# Patient Record
Sex: Male | Born: 1940 | Race: White | Hispanic: No | Marital: Married | Smoking: Never smoker
Health system: Southern US, Community
[De-identification: ages and names within clinical notes are randomized; demographics above are authoritative.]

## PROBLEM LIST (undated history)

## (undated) DIAGNOSIS — M1990 Unspecified osteoarthritis, unspecified site: Secondary | ICD-10-CM

## (undated) DIAGNOSIS — T7840XA Allergy, unspecified, initial encounter: Secondary | ICD-10-CM

## (undated) DIAGNOSIS — I10 Essential (primary) hypertension: Secondary | ICD-10-CM

## (undated) DIAGNOSIS — J189 Pneumonia, unspecified organism: Secondary | ICD-10-CM

## (undated) HISTORY — PX: HERNIA REPAIR: SHX51

## (undated) HISTORY — PX: ABDOMINAL AORTIC ANEURYSM REPAIR: SUR1152

## (undated) HISTORY — PX: UPPER GI ENDOSCOPY: SHX6162

## (undated) HISTORY — PX: OTHER SURGICAL HISTORY: SHX169

---

## 2004-04-02 ENCOUNTER — Encounter: Payer: Self-pay | Admitting: Cardiology

## 2006-08-08 ENCOUNTER — Inpatient Hospital Stay (HOSPITAL_COMMUNITY): Admission: RE | Admit: 2006-08-08 | Discharge: 2006-08-12 | Payer: Self-pay | Admitting: Specialist

## 2006-09-01 ENCOUNTER — Encounter: Admission: RE | Admit: 2006-09-01 | Discharge: 2006-10-09 | Payer: Self-pay | Admitting: Specialist

## 2007-02-04 ENCOUNTER — Ambulatory Visit: Payer: Self-pay | Admitting: Vascular Surgery

## 2008-01-26 ENCOUNTER — Ambulatory Visit: Payer: Self-pay | Admitting: Vascular Surgery

## 2008-04-03 ENCOUNTER — Encounter: Admission: RE | Admit: 2008-04-03 | Discharge: 2008-04-03 | Payer: Self-pay | Admitting: Family Medicine

## 2008-05-09 ENCOUNTER — Encounter
Admission: RE | Admit: 2008-05-09 | Discharge: 2008-06-22 | Payer: Self-pay | Admitting: Physical Medicine and Rehabilitation

## 2008-07-19 ENCOUNTER — Ambulatory Visit: Payer: Self-pay | Admitting: Vascular Surgery

## 2009-01-24 ENCOUNTER — Ambulatory Visit: Payer: Self-pay | Admitting: Vascular Surgery

## 2009-08-31 ENCOUNTER — Ambulatory Visit: Payer: Self-pay | Admitting: Vascular Surgery

## 2009-09-14 ENCOUNTER — Encounter: Admission: RE | Admit: 2009-09-14 | Discharge: 2009-09-14 | Payer: Self-pay | Admitting: Vascular Surgery

## 2009-09-14 ENCOUNTER — Ambulatory Visit: Payer: Self-pay | Admitting: Vascular Surgery

## 2009-09-18 ENCOUNTER — Ambulatory Visit (HOSPITAL_COMMUNITY): Admission: RE | Admit: 2009-09-18 | Discharge: 2009-09-18 | Payer: Self-pay | Admitting: Vascular Surgery

## 2009-09-18 ENCOUNTER — Ambulatory Visit: Payer: Self-pay | Admitting: Vascular Surgery

## 2009-09-20 ENCOUNTER — Encounter: Payer: Self-pay | Admitting: Cardiology

## 2009-09-21 ENCOUNTER — Ambulatory Visit: Payer: Self-pay

## 2009-09-21 ENCOUNTER — Encounter: Payer: Self-pay | Admitting: Cardiology

## 2009-09-21 ENCOUNTER — Ambulatory Visit: Payer: Self-pay | Admitting: Cardiovascular Disease

## 2009-09-21 ENCOUNTER — Encounter (HOSPITAL_COMMUNITY): Admission: RE | Admit: 2009-09-21 | Discharge: 2009-12-08 | Payer: Self-pay | Admitting: Cardiovascular Disease

## 2009-09-25 DIAGNOSIS — I714 Abdominal aortic aneurysm, without rupture, unspecified: Secondary | ICD-10-CM | POA: Insufficient documentation

## 2009-09-26 ENCOUNTER — Ambulatory Visit: Payer: Self-pay | Admitting: Cardiology

## 2009-09-26 ENCOUNTER — Encounter (INDEPENDENT_AMBULATORY_CARE_PROVIDER_SITE_OTHER): Payer: Self-pay | Admitting: *Deleted

## 2009-09-26 DIAGNOSIS — I10 Essential (primary) hypertension: Secondary | ICD-10-CM | POA: Insufficient documentation

## 2009-09-28 ENCOUNTER — Telehealth (INDEPENDENT_AMBULATORY_CARE_PROVIDER_SITE_OTHER): Payer: Self-pay | Admitting: *Deleted

## 2009-09-28 ENCOUNTER — Ambulatory Visit: Payer: Self-pay | Admitting: Vascular Surgery

## 2009-09-28 ENCOUNTER — Encounter: Payer: Self-pay | Admitting: Cardiology

## 2009-10-09 ENCOUNTER — Telehealth (INDEPENDENT_AMBULATORY_CARE_PROVIDER_SITE_OTHER): Payer: Self-pay | Admitting: *Deleted

## 2009-10-11 ENCOUNTER — Inpatient Hospital Stay (HOSPITAL_COMMUNITY): Admission: RE | Admit: 2009-10-11 | Discharge: 2009-10-16 | Payer: Self-pay | Admitting: Vascular Surgery

## 2009-10-11 ENCOUNTER — Encounter: Payer: Self-pay | Admitting: Vascular Surgery

## 2009-10-26 ENCOUNTER — Ambulatory Visit: Payer: Self-pay | Admitting: Vascular Surgery

## 2009-11-09 ENCOUNTER — Encounter: Payer: Self-pay | Admitting: Cardiology

## 2009-11-09 ENCOUNTER — Ambulatory Visit: Payer: Self-pay | Admitting: Vascular Surgery

## 2010-05-31 ENCOUNTER — Ambulatory Visit: Payer: Self-pay | Admitting: Vascular Surgery

## 2010-06-28 ENCOUNTER — Encounter: Admission: RE | Admit: 2010-06-28 | Discharge: 2010-06-28 | Payer: Self-pay | Admitting: Family Medicine

## 2010-09-28 IMAGING — CT CT PELVIS W/ CM
2 of 5 series · 10 of 36 positions shown, 17 images · IV contrast (READICAT/WATER & [ID] OMNI 300)
Comparison: None.

CT ABDOMEN

Addendum Begins

The patient had a CT of the abdomen pelvis on [DATE]  at [facility]
[[facility]OSP[facility]].  This report has been reviewed.  The soft tissue
mass in the anterior abdominal wall on the left was described on
the prior report however was not measured to determine if there has
been any interval change.
The prior report also describes a 16 x 12 mm cyst in the head of
the pancreas, this is unchanged and has a benign appearance.
Addendum Ends
CLINICAL DATA: AAA, pre surgery.
CT ABDOMEN AND PELVIS WITH CONTRAST
TECHNIQUE: Multidetector CT imaging of the abdomen and pelvis was
performed using the standard protocol following bolus
administration of intravenous contrast.
Contrast: 125 ml 9mnipaque-AUU IV.

[Series 4: routine abdomen · axial · 0.78mm/px · z∈[-358,+72]mm · 9 of 108 slices shown, 15 images]
[im 11/108  soft-tissue]
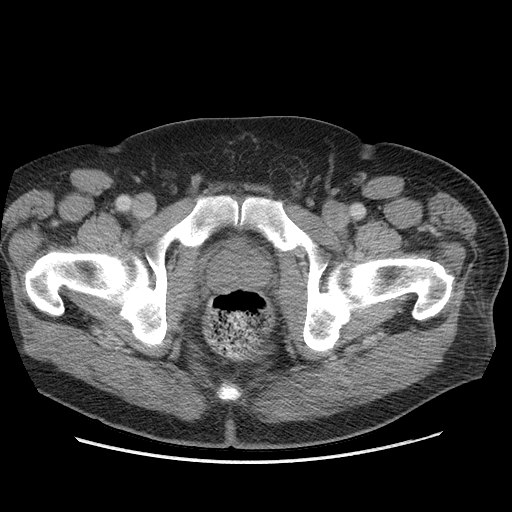
[im 11/108  bone]
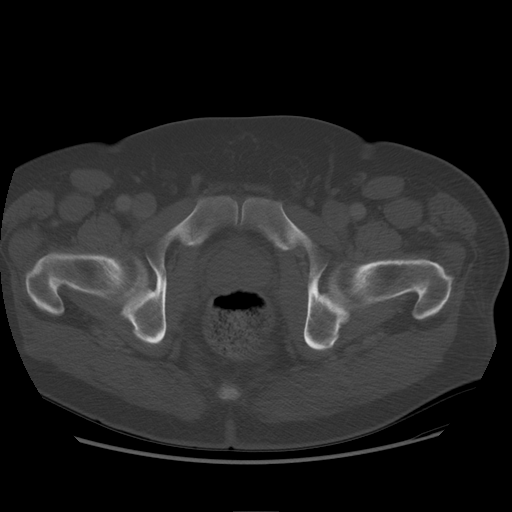
[im 22/108  soft-tissue]
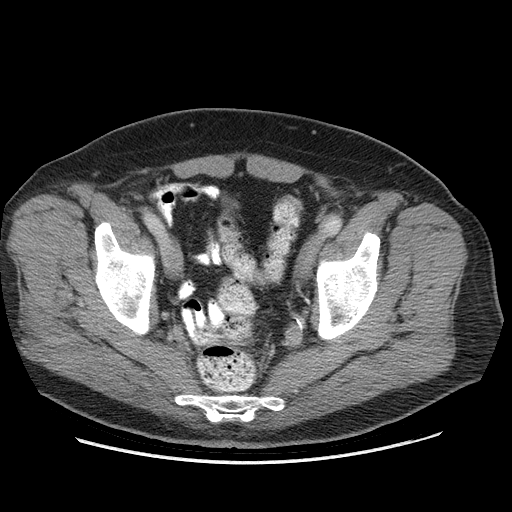
[im 33/108  soft-tissue]
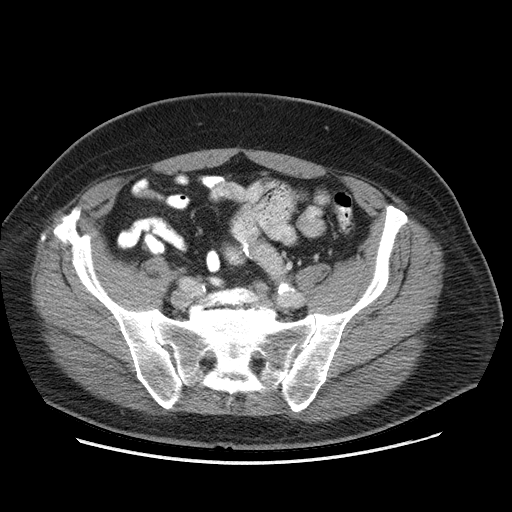
[im 43/108  soft-tissue]
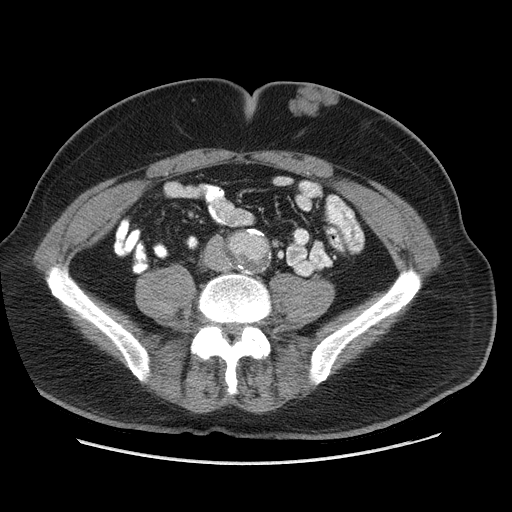
[im 54/108  soft-tissue]
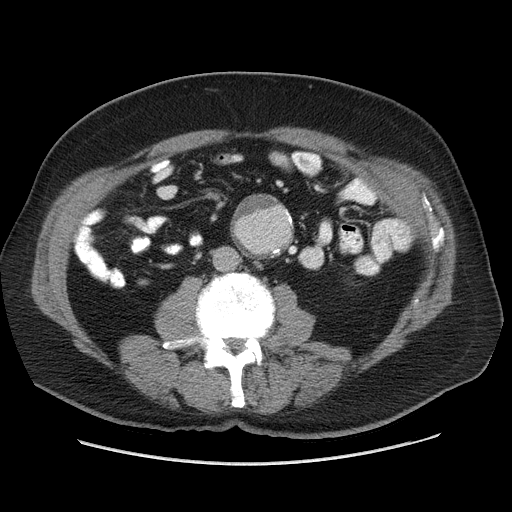
[im 65/108  soft-tissue]
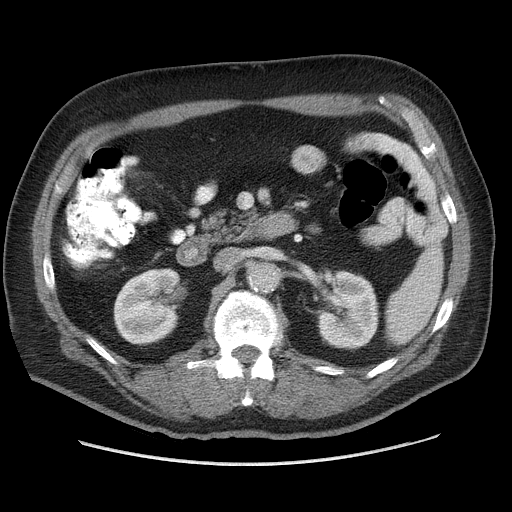
[im 65/108  lung]
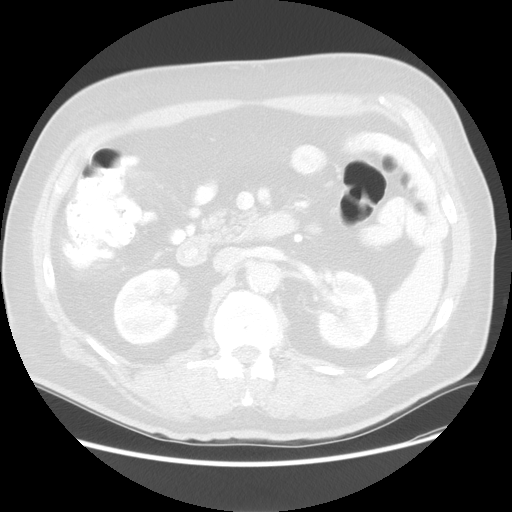
[im 75/108  soft-tissue]
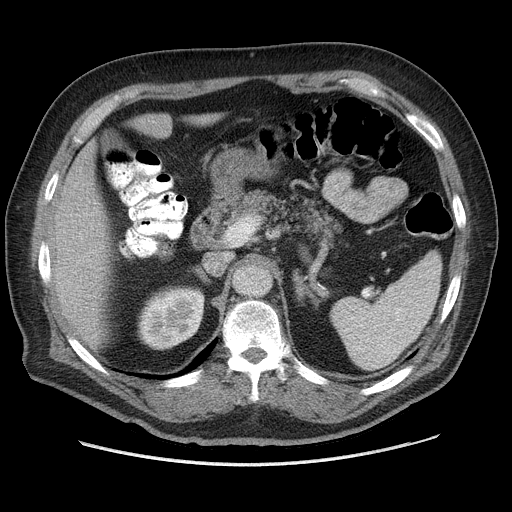
[im 75/108  lung]
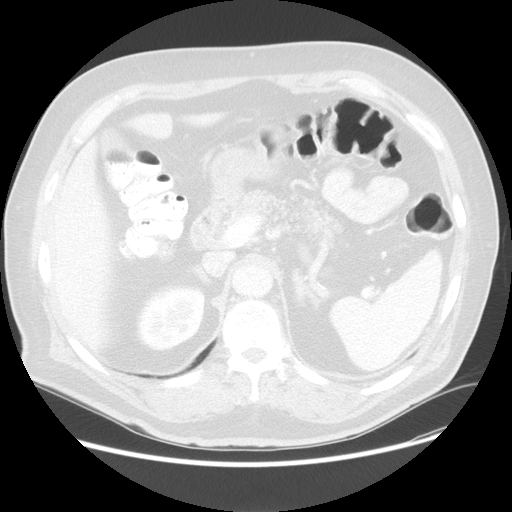
[im 86/108  soft-tissue]
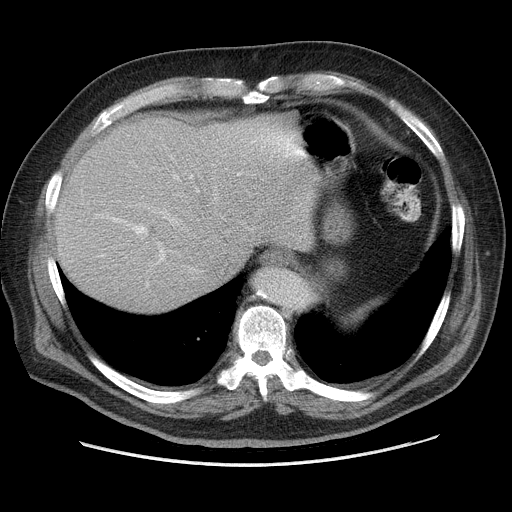
[im 86/108  lung]
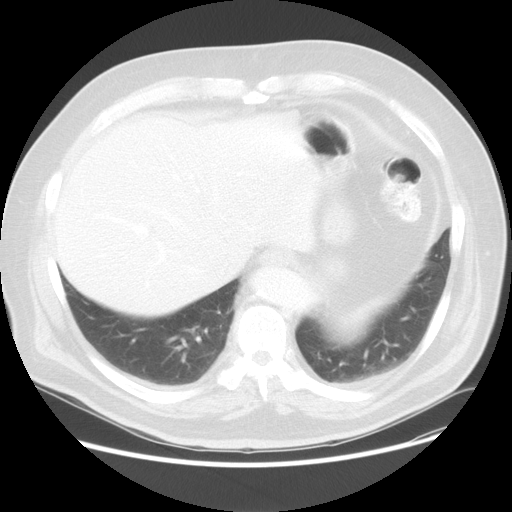
[im 97/108  soft-tissue]
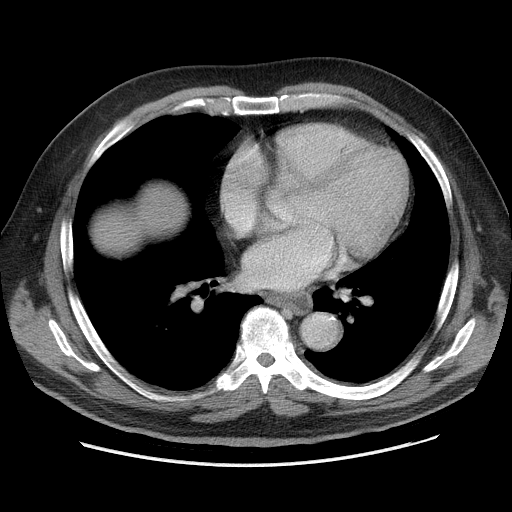
[im 97/108  lung]
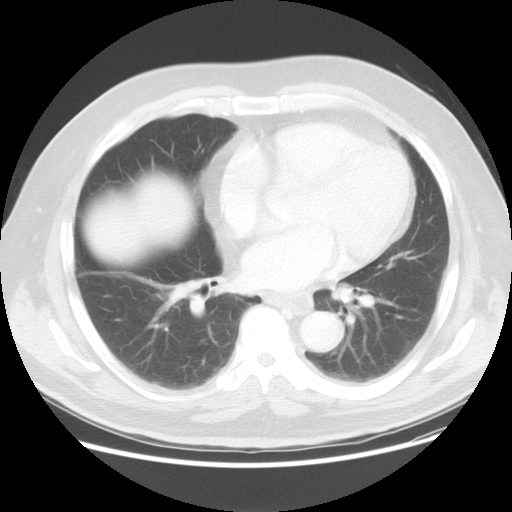
[im 97/108  bone]
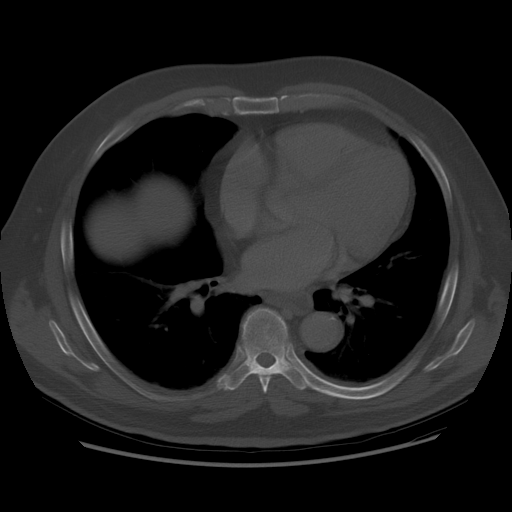

[Series 601: coronal body · coronal · 1.11mm/px · 1 of 131 slices shown, 2 images]
[im 44/131  soft-tissue]
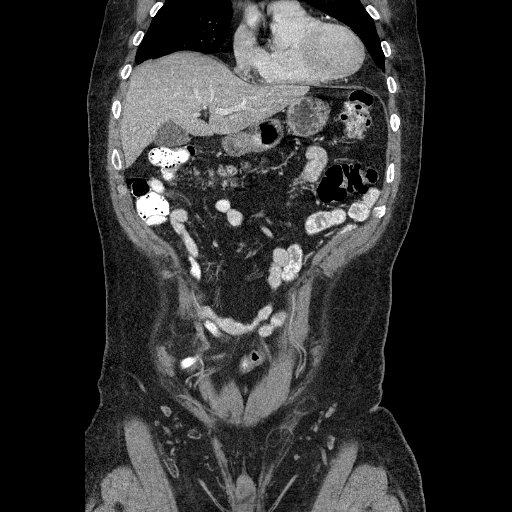
[im 44/131  bone]
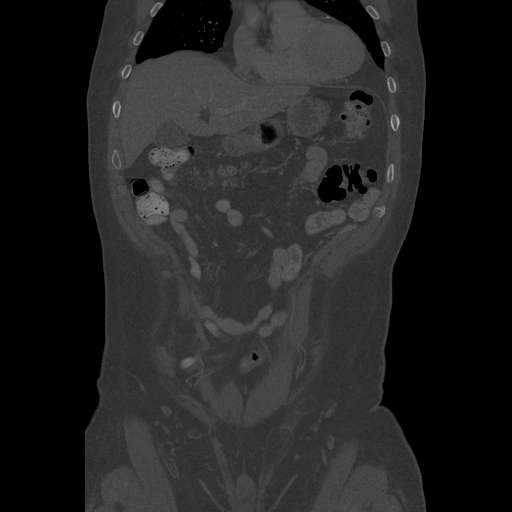

[10 of 36 positions shown; findings below may reference images not displayed]

FINDINGS: There is an infrarenal abdominal aortic aneurysm
measuring up to 5.3 x 5.0 cm.  There is mural thrombus in the
aneurysm and atherosclerotic calcification in the aortic wall.  The
infrarenal abdominal aorta is very tortuous.  The aneurysm does not
involve the renal artery origin.

The liver and gallbladder are normal.  The pancreas spleen and
kidneys are normal.  There is no bowel obstruction or bowel wall
thickening.
IMPRESSION: Infrarenal abdominal aortic aneurysm measuring 5.3 x 5.0 cm.  This
is an atherosclerotic aneurysm with mural thrombus.

CT PELVIS
FINDINGS: The abdominal aortic aneurysm extends to the aortic
bifurcation.  The iliac arteries are diffusely enlarged without a
focal aneurysm.  The right common iliac artery measures 16 mm in
diameter and the left common iliac artery measures 17 mm in
diameter.  This is felt to be due to atherosclerosis.  The iliac
arteries appear widely patent bilaterally.

There is sigmoid diverticulosis.  No acute bowel thickening or
obstruction.  There is no free fluid or adenopathy.  There is an
inguinal hernia on the left containing fat.

There is a lobular soft tissue mass in the subcutaneous fat to the
left of umbilicus.  This measures approximately 2.5 x 3 cm.  There
is a small calcification associate with the mass.  This does not
extend down to the abdominal wall.  This is of uncertain etiology.
Recommend clinical evaluation.  Neurofibroma, metastatic disease,
and lymphoma are possibilities as well as benign skin lesions.
IMPRESSION: Atherosclerotic dilatation of the internal and external iliac
arteries bilaterally without focal aneurysm or arterial stenosis.

Subcutaneous soft tissue mass to the left of the umbilicus of
uncertain etiology.

## 2010-10-25 IMAGING — CR DG CHEST 1V PORT
1 series · 1 of 1 positions shown · non-contrast
Comparison: 10/09/2009

CLINICAL DATA: Status post repair of aneurysm

PORTABLE CHEST - 1 VIEW

[AP]
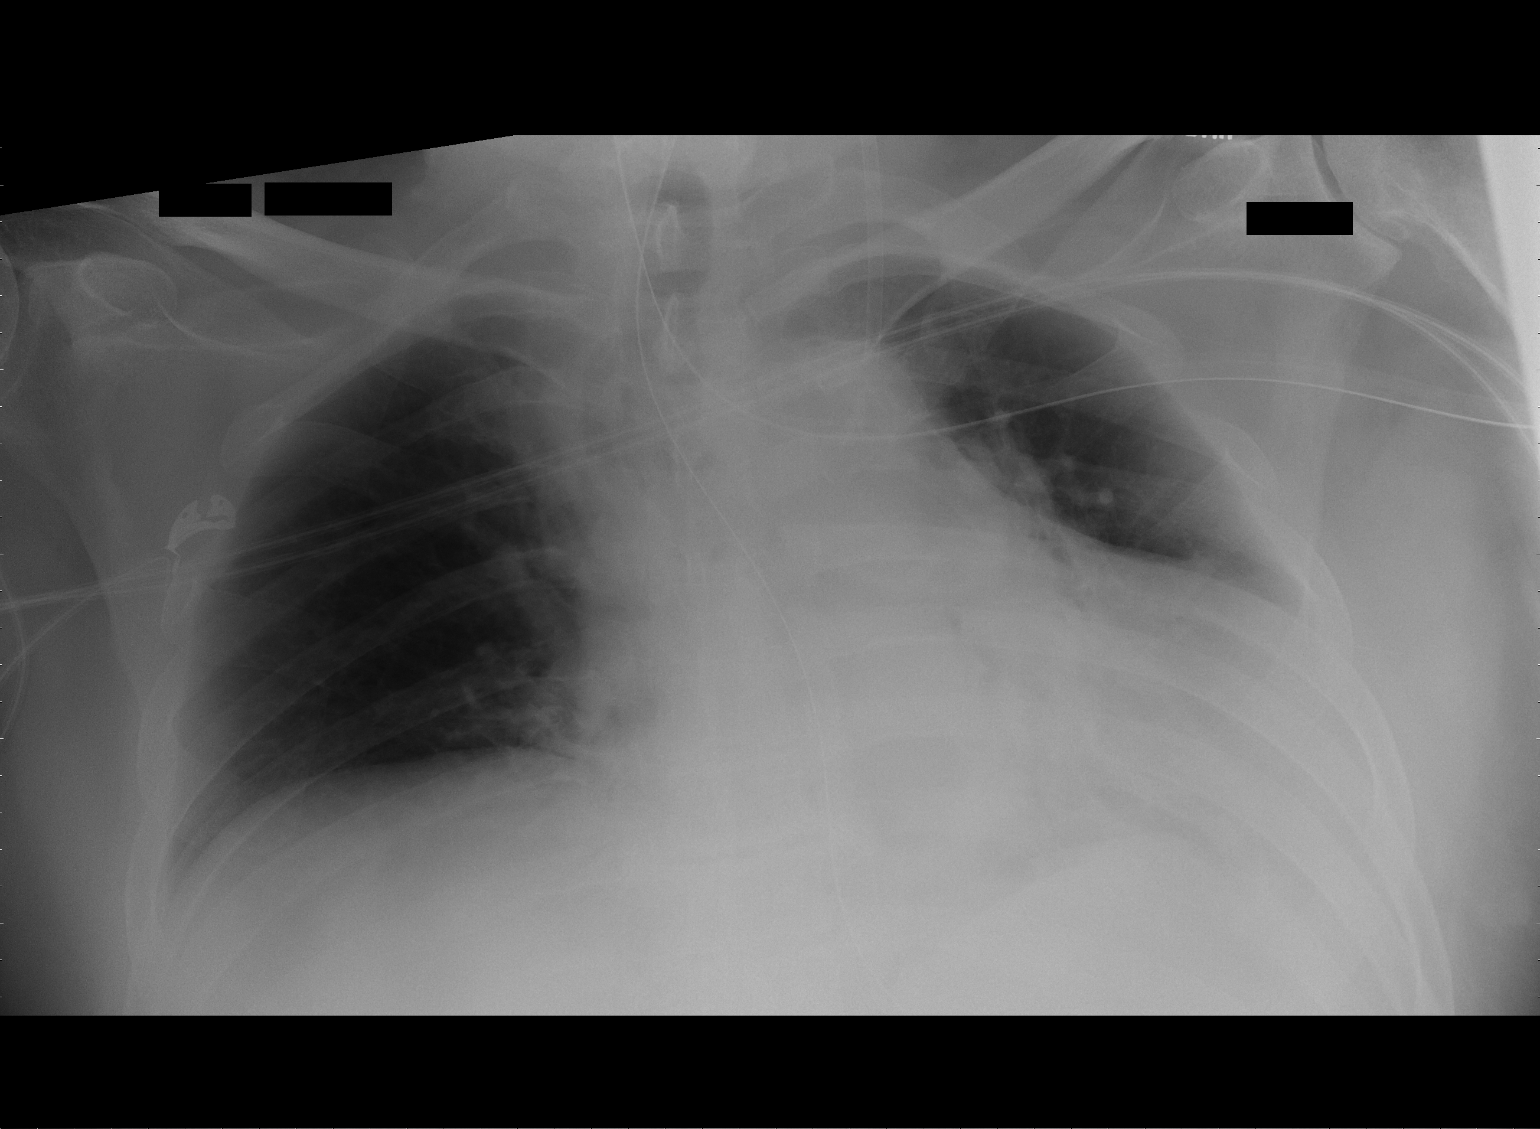

[1 of 1 positions shown; findings below may reference images not displayed]

FINDINGS: There is a left IJ catheter sheath in place the tip is in the
projection of the left lung apex.

There is a nasogastric tube in place.

The heart size is enlarged.

The lung volumes are very low.

No airspace consolidation identified.
IMPRESSION: 1.  Very low lung volumes.
2.  Left IJ catheter sheath in place.

## 2011-03-13 LAB — BASIC METABOLIC PANEL
CO2: 24 mEq/L (ref 19–32)
Calcium: 8 mg/dL — ABNORMAL LOW (ref 8.4–10.5)
Chloride: 101 mEq/L (ref 96–112)
Creatinine, Ser: 0.92 mg/dL (ref 0.4–1.5)
GFR calc Af Amer: >60 mL/min (ref >60)
Glucose, Bld: 156 mg/dL — ABNORMAL HIGH (ref 70–99)
Sodium: 132 mEq/L — ABNORMAL LOW (ref 135–145)

## 2011-03-13 LAB — BASIC METABOLIC PANEL WITH GFR
BUN: 14 mg/dL (ref 6–23)
CO2: 25 meq/L (ref 19–32)
Calcium: 7.7 mg/dL — ABNORMAL LOW (ref 8.4–10.5)
Chloride: 103 meq/L (ref 96–112)
Creatinine, Ser: 1.07 mg/dL (ref 0.4–1.5)
GFR calc Af Amer: >60 mL/min (ref >60)
GFR calc non Af Amer: >60 mL/min (ref >60)
Glucose, Bld: 163 mg/dL — ABNORMAL HIGH (ref 70–99)
Potassium: 3.9 meq/L (ref 3.5–5.1)
Sodium: 134 meq/L — ABNORMAL LOW (ref 135–145)

## 2011-03-13 LAB — TYPE AND SCREEN
ABO/RH(D): O POS
ABO/RH(D): O POS
Antibody Screen: POSITIVE
DAT, IgG: NEGATIVE
Donor AG Type: NEGATIVE
Donor AG Type: NEGATIVE
PT AG Type: NEGATIVE

## 2011-03-13 LAB — AMYLASE: Amylase: 119 U/L (ref 27–131)

## 2011-03-13 LAB — POCT I-STAT 7, (LYTES, BLD GAS, ICA,H+H)
Acid-Base Excess: 1 mmol/L (ref 0.0–2.0)
Bicarbonate: 24.7 mEq/L — ABNORMAL HIGH (ref 20.0–24.0)
O2 Saturation: 100 %
Patient temperature: 36.7
Sodium: 135 mEq/L (ref 135–145)
TCO2: 26 mmol/L (ref 0–100)
pH, Arterial: 7.403 (ref 7.350–7.450)
pH, Arterial: 7.459 — ABNORMAL HIGH (ref 7.350–7.450)
pO2, Arterial: 275 mmHg — ABNORMAL HIGH (ref 80.0–100.0)
pO2, Arterial: 360 mmHg — ABNORMAL HIGH (ref 80.0–100.0)

## 2011-03-13 LAB — COMPREHENSIVE METABOLIC PANEL
ALT: 14 U/L (ref 0–53)
AST: 15 U/L (ref 0–37)
Alkaline Phosphatase: 44 U/L (ref 39–117)
BUN: 13 mg/dL (ref 6–23)
CO2: 25 mEq/L (ref 19–32)
CO2: 27 mEq/L (ref 19–32)
Calcium: 8.9 mg/dL (ref 8.4–10.5)
Chloride: 103 mEq/L (ref 96–112)
Creatinine, Ser: 1.03 mg/dL (ref 0.4–1.5)
GFR calc non Af Amer: >60 mL/min (ref >60)
GFR calc non Af Amer: >60 mL/min (ref >60)
Glucose, Bld: 102 mg/dL — ABNORMAL HIGH (ref 70–99)
Potassium: 4 mEq/L (ref 3.5–5.1)
Total Bilirubin: 1 mg/dL (ref 0.3–1.2)
Total Protein: 6.7 g/dL (ref 6.0–8.3)

## 2011-03-13 LAB — BLOOD GAS, ARTERIAL
Bicarbonate: 25.4 mEq/L — ABNORMAL HIGH (ref 20.0–24.0)
Drawn by: 181601
FIO2: 0.21 %
O2 Content: 10 L/min
Patient temperature: 98.6
pH, Arterial: 7.338 — ABNORMAL LOW (ref 7.350–7.450)
pH, Arterial: 7.419 (ref 7.350–7.450)
pO2, Arterial: 88.5 mmHg (ref 80.0–100.0)

## 2011-03-13 LAB — CBC
HCT: 34.2 % — ABNORMAL LOW (ref 39.0–52.0)
HCT: 38.5 % — ABNORMAL LOW (ref 39.0–52.0)
HCT: 40.9 % (ref 39.0–52.0)
Hemoglobin: 11.3 g/dL — ABNORMAL LOW (ref 13.0–17.0)
Hemoglobin: 13.4 g/dL (ref 13.0–17.0)
Hemoglobin: 14.1 g/dL (ref 13.0–17.0)
MCHC: 34.9 g/dL (ref 30.0–36.0)
MCV: 93.3 fL (ref 78.0–100.0)
Platelets: 187 10*3/uL (ref 150–400)
RBC: 3.66 MIL/uL — ABNORMAL LOW (ref 4.22–5.81)
RBC: 4.36 MIL/uL (ref 4.22–5.81)
RDW: 13.5 % (ref 11.5–15.5)
WBC: 11.4 10*3/uL — ABNORMAL HIGH (ref 4.0–10.5)
WBC: 16.8 10*3/uL — ABNORMAL HIGH (ref 4.0–10.5)

## 2011-03-13 LAB — PROTIME-INR
INR: 1.11 (ref 0.00–1.49)
Prothrombin Time: 14.2 seconds (ref 11.6–15.2)

## 2011-03-13 LAB — GLUCOSE, CAPILLARY: Glucose-Capillary: 131 mg/dL — ABNORMAL HIGH (ref 70–99)

## 2011-03-13 LAB — URINALYSIS, ROUTINE W REFLEX MICROSCOPIC
Glucose, UA: NEGATIVE mg/dL
Ketones, ur: NEGATIVE mg/dL
Nitrite: NEGATIVE
Protein, ur: NEGATIVE mg/dL

## 2011-03-13 LAB — MAGNESIUM: Magnesium: 1.8 mg/dL (ref 1.5–2.5)

## 2011-03-13 LAB — APTT: aPTT: 30 seconds (ref 24–37)

## 2011-03-14 LAB — POCT I-STAT, CHEM 8
Calcium, Ion: 1 mmol/L — ABNORMAL LOW (ref 1.12–1.32)
Chloride: 104 mEq/L (ref 96–112)
Creatinine, Ser: 1.1 mg/dL (ref 0.4–1.5)
Potassium: 8.5 mEq/L (ref 3.5–5.1)
Sodium: 132 mEq/L — ABNORMAL LOW (ref 135–145)
TCO2: 27 mmol/L (ref 0–100)

## 2011-03-20 ENCOUNTER — Encounter: Payer: Self-pay | Admitting: Physician Assistant

## 2011-03-20 DIAGNOSIS — I10 Essential (primary) hypertension: Secondary | ICD-10-CM

## 2011-03-20 DIAGNOSIS — J309 Allergic rhinitis, unspecified: Secondary | ICD-10-CM

## 2011-03-20 DIAGNOSIS — E785 Hyperlipidemia, unspecified: Secondary | ICD-10-CM

## 2011-03-20 DIAGNOSIS — M1990 Unspecified osteoarthritis, unspecified site: Secondary | ICD-10-CM

## 2011-04-23 NOTE — Assessment & Plan Note (Signed)
OFFICE VISIT   WILDON, CUEVAS  DOB:  May 14, 1941                                       07/19/2008  CHART#:[id number]   I saw the patient in the office today for continued followup of his  abdominal aortic aneurysm.  I last saw him in February of this year at  which time the maximum diameter was 4.6 cm.  He comes in for a 50-month  followup visit.  Since I saw him last has had no significant abdominal  or back pain.  There has been no significant change in the medical  history.   REVIEW OF SYSTEMS:  He has had no recent chest pain, chest pressure,  palpitations or arrhythmias.  Had no bronchitis, asthma or wheezing.   PHYSICAL EXAMINATION:  Blood pressure 158/101, heart rate is 77.  I do  not detect any carotid bruits.  Lungs are clear bilaterally to  auscultation.  Cardiac exam, he has a regular rate and rhythm.  Abdomen:  Soft and nontender.  His aneurysm is nontender.  He has palpable femoral  and posterior tibial pulses bilaterally.  No significant lower extremity  swelling.  No evidence of atheroembolic disease.   Ultrasound in our office today shows the maximum diameter of his  aneurysm is 4.8 cm and it has not changed significantly over the last 6  months.   He understands we generally would not consider elective repair unless  the aneurysm reached 5.5 cm in maximum diameter.  This is based on  American Heart Association recommendations.  I plan on seeing him back  in 6 months for followup ultrasound.  He knows to call sooner if he has  problems.   Di Kindle. Edilia Bo, M.D.  Electronically Signed   CSD/MEDQ  D:  07/19/2008  T:  07/20/2008  Job:  [id number]

## 2011-04-23 NOTE — Assessment & Plan Note (Signed)
OFFICE VISIT   Russell Wagner, Russell Wagner  DOB:  Mar 16, 1941                                       11/09/2009  EAVWU#:[id number]   I saw the patient in the office today for followup after recent repair  of his infrarenal abdominal aortic aneurysm.  This is a 70 year old  gentleman that I have been following with an aneurysm since 2007.  On a  recent followup study he was noted to have mobile thrombus within the  infrarenal aorta and the aneurysm was not especially large and we  recommended a CT scan which showed the aneurysm had actually enlarged to  5.3 cm.  Given the mural thrombus which was mobile and the risk of  embolization and the enlargement of the aneurysm elective repair was  recommended.  He had a very tortuous proximal neck and was not felt to  be a good candidate for endovascular repair of his aneurysm.  He  underwent open repair of his aneurysm on 10/11/2009.  He did well  postoperatively and was discharged on postoperative day #5.  He returns  for his first outpatient visit.  Overall he has been doing quite well  and he has no specific complaints.  He has been gradually resuming his  normal activities.  He has been eating well and his bowels have been  functioning normally.   PHYSICAL EXAMINATION:  General:  This is a pleasant 70 year old  gentleman who appears his stated age.  Vital signs:  Blood pressure is  158/101, heart rate is 84, temperature 98.  Lungs:  Are clear  bilaterally to auscultation.  Cardiac:  He has a regular rate and  rhythm.  Abdomen:  Soft and nontender.  His incision is healing nicely.  He has palpable femoral and pedal pulses bilaterally.   Overall I am pleased with his progress and I will plan on seeing him  back in 6 months for one final followup visit.  I have instructed him  that if he receives any invasive procedures such as tooth extraction or  colonoscopy that he should make the physician aware that he has an  aortic  graft so that he can receive prophylactic antibiotics.  I have  also encouraged him not to do any heavy lifting for 6 months.   Di Kindle. Edilia Bo, M.D.  Electronically Signed   CSD/MEDQ  D:  11/09/2009  T:  11/10/2009  Job:  [id number]   cc:   Ernestina Penna, M.D.  Luis Abed, MD, [facility]

## 2011-04-23 NOTE — Assessment & Plan Note (Signed)
OFFICE VISIT   BEN, HABERMANN  DOB:  1941-09-22                                       05/31/2010  EAVWU#:[id number]   I saw the patient in the office today for continued followup after  repair of his infrarenal abdominal aortic aneurysm on 10/11/2009.  He  comes in for a six month followup visit.  Since I saw him last he has  had no history of abdominal or back pain.  He has been gradually  resuming his normal activities.  His appetite has returned and he has  had no other problems related to his surgery.   His past medical history is significant for hyperlipidemia,  hypertension, both of which are stable on his current medications.   SOCIAL HISTORY:  He is married.  He has one child.  He is retired.  He  does not smoke cigarettes.   REVIEW OF SYSTEMS:  CARDIOVASCULAR:  He has had no chest pain, chest  pressure, palpitations or arrhythmias.  He has had no significant  dyspnea on exertion.  He denies orthopnea.  He has had no claudication,  rest pain or nonhealing ulcers.  He has had no history of stroke or  TIAs.  He has had no history of DVT.  PULMONARY:  He has had no productive cough, bronchitis, asthma or  wheezing.  GI:  He has had no recent change in his bowel habits.   PHYSICAL EXAMINATION:  General:  This is a pleasant 70 year old  gentleman who appears his stated age.  Vital signs:  Blood pressure is  139/88, heart rate 71, saturation 99%.  HEENT:  Unremarkable.  Lungs:  Are clear bilaterally to auscultation.  Cardiovascular:  I do not detect  any carotid bruits.  He has a regular rate and rhythm.  He has palpable  femoral pulses and palpable pedal pulses.  Abdomen:  Soft and nontender.  His incision has healed nicely with a nice healing ridge.  He has normal  pitched bowel sounds.  Neurologic:  Exam is nonfocal.   Overall I am pleased with his progress and at this point will just see  him back p.r.n.  He is followed closely by Dr. Christell Constant  and will be happy  to see him back if any new vascular issues arise.  We have discussed the  importance of receiving prophylactic antibiotics with invasive  procedures given his history of aortic graft surgery.     Di Kindle. Edilia Bo, M.D.  Electronically Signed   CSD/MEDQ  D:  05/31/2010  T:  06/01/2010  Job:  [id number]   cc:   Ernestina Penna, M.D.

## 2011-04-23 NOTE — Assessment & Plan Note (Signed)
OFFICE VISIT   Russell Wagner, Russell Wagner  DOB:  07-07-1941                                       01/26/2008  ZHYQM#:[id number]   I saw the patient in the office today for continued followup of his  abdominal aortic aneurysm.  I last saw him a year ago, at which time his  aneurysm was 3.9 cm in maximum diameter.  He comes in for a yearly  followup visit.  Since I saw him last, he has had no significant  abdominal or back pain.  He has had no significant change in his medical  history.  He has a history of hypertension and hypercholesterolemia.   SOCIAL HISTORY:  He is not a smoker.   REVIEW OF SYSTEMS:  He does have arthritis.  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  He  is considering knee replacement on the right and is followed by Dr.  Thomasena Edis.  His review of systems is otherwise unremarkable and is  documented in the medical history form in his chart, as are his  medications.   PHYSICAL EXAMINATION:  This is a pleasant 70 year old gentleman who  appears his stated age.  Blood pressure is 159/99, heart rate is 83.  I  do not detect any carotid bruits.  Lungs are clear bilaterally to  auscultation.  On cardiac exam, he has a regular rate and rhythm.  His  abdomen is soft and nontender.  His aneurysm is palpable and nontender.  He has palpable femoral, popliteal, and posterior tibial pulses  bilaterally.  He has no significant lower extremity swelling.   I reviewed his ultrasound from today, which shows the aneurysm has  enlarged slightly to 4.56 cm in maximum diameter.  This has been over  the course of the last year.   Given that the aneurysm has enlarged somewhat, I recommended we see him  back in 6 months with a followup duplex rather than waiting 1 year.  He  understands that we would not generally consider elective repair unless  the aneurysm reached 5.5 cm in maximum diameter.  This is based on  American Heart Association guidelines.  He is considering knee  replacement on the right, as he had such an excellent result on the  left, and I do not see that his aneurysm would be a contraindication for  this.  I plan on seeing him back in 6 months.  He states that his blood  pressure is much better at home, and that it usually is only elevated  when he is in the doctor's office, but I have stressed to him the  importance of keeping his blood pressure well-controlled.  Fortunately,  he is not a smoker.   Will see him back in 6 months.  He knows to call sooner if he has  problems.   Di Kindle. Edilia Bo, M.D.  Electronically Signed   CSD/MEDQ  D:  01/26/2008  T:  01/27/2008  Job:  [id number]   cc:   Ernestina Penna, M.D.  Dr. Hayden Rasmussen

## 2011-04-23 NOTE — Assessment & Plan Note (Signed)
OFFICE VISIT   Russell Wagner, Russell Wagner  DOB:  February 19, 1941                                       09/14/2009  WUJWJ#:[id number]   I saw the patient in the office today for continued followup of his  aneurysm.  This is a pleasant 70 year old gentleman who I have been  following with an abdominal aortic aneurysm since August of 2007 when it  measured 4.3 cm in maximum diameter.  On 08/31/2009 the aneurysm  measured 4.85 cm in maximum diameter.  There has not been any  significant change over the last 6 months.  However, it was noted on his  ultrasound that there was some mobile thrombus within the infrarenal  aorta.  For this reason I had recommended a CT angio to further evaluate  this.  He comes in today to discuss these results.   His CT angio today shows that the maximum diameter of his aneurysm is  5.3 cm.  There is significant laminated thrombus within the aneurysm.  The iliacs are somewhat ectatic but not aneurysmal.   PAST MEDICAL HISTORY:  Significant for hypertension and  hypercholesterolemia.  He denies any history of diabetes, history of  previous myocardial infarction, history of congestive heart failure or  history of COPD.   FAMILY HISTORY:  There is no history of premature cardiovascular  disease.  He does have 2 first cousins who had abdominal aortic  aneurysms, however.   SOCIAL HISTORY:  He is married.  He has 1 child.  He does not use  tobacco.   ALLERGIES:  Penicillin.   MEDICATIONS:  1. Diovan/HCTZ 320/12.5 p.o. daily.  2. Crestor 10 mg p.o. daily.  3. Voltaren 75 mg p.o. daily.   REVIEW OF SYSTEMS:  Is documented on the medical history form in his  chart and is significant only for arthritis.  He denies any chest pain,  chest pressure, palpitations or arrhythmias.  PULMONARY:  He has had no productive cough, bronchitis, asthma or  wheezing.  GI, GU, vascular, neuro, psychiatric, ENT and hematologic review of  systems are  unremarkable.   PHYSICAL EXAMINATION:  This is a pleasant 70 year old gentleman who  appears his stated age.  Blood pressure is 172/105, heart rate is 99.  HEENT is unremarkable.  Neck is supple.  I do not detect any carotid  bruits.  Lungs are clear bilaterally to auscultation.  On cardiac exam  he has a regular rate and rhythm.  Abdomen is soft and nontender.  His  aneurysm is palpable and nontender.  He has palpable femoral, popliteal  and pedal pulses bilaterally.  He has no evidence of atheroembolic  disease.  Neurologic exam is nonfocal.   I reviewed his CT scan which shows the maximum diameter of his aneurysm  is 5.3 cm.  Given the continued enlargement on the aneurysm which is now  approaching 5.5 cm and also with his mural thrombus present I have  recommended elective repair.  I have explained that I think the risk of  rupture without repair is 5%-7% per year.  It appears that he is  probably not a candidate for endovascular repair of his aneurysm given  the significant tortuosity of the neck and significant angulation.  It  appears on his CT scan that the angulation is clearly greater than 60%  which would be a contraindication to endovascular  repair.   I would like to proceed with arteriography to further assess the  aneurysm and also will arrange for a preoperative cardiac evaluation.  Once his workup is complete we can schedule elective open repair of his  aneurysm.  I have discussed the indications for surgery and the  potential complications including but not limited to bleeding, wound  healing problems, MI, graft infection or other unpredictable medical  problems.  All his questions were answered.  He is agreeable to proceed.  We will make further recommendations pending results of his workup.   Di Kindle. Edilia Bo, M.D.  Electronically Signed   CSD/MEDQ  D:  09/14/2009  T:  09/15/2009  Job:  [id number]   cc:   Ernestina Penna, M.D.  [facility]

## 2011-04-23 NOTE — Procedures (Signed)
DUPLEX ULTRASOUND OF ABDOMINAL AORTA   INDICATION:  Follow up abdominal aortic aneurysm.   HISTORY:  Diabetes:  No.  Cardiac:  No.  Hypertension:  Yes.  Smoking:  No.  Connective Tissue Disorder:  Family History:  No.  Previous Surgery:  No.   DUPLEX EXAM:         AP (cm)                   TRANSVERSE (cm)  Proximal             2.90 cm                   2.84 cm  Mid                  4.75 cm                   4.86 cm  Distal               3.57 cm                   3.46 cm  Right Iliac          1.70 cm                   1.77 cm  Left Iliac           1.57 cm                   1.61 cm   PREVIOUS:  Date:  AP:  4.75  TRANSVERSE:  4.78   IMPRESSION:  There is no significant change in abdominal aortic aneurysm  since the last study.   ___________________________________________  Di Kindle. Edilia Bo, M.D.   AC/MEDQ  D:  01/24/2009  T:  01/24/2009  Job:  [id number]

## 2011-04-23 NOTE — Procedures (Signed)
DUPLEX ULTRASOUND OF ABDOMINAL AORTA   INDICATION:  Followup abdominal aortic aneurysm.   HISTORY:  Diabetes:  No.  Cardiac:  No.  Hypertension:  Yes.  Smoking:  No.  Connective Tissue Disorder:  Family History:  No.  Previous Surgery:  No.   DUPLEX EXAM:         AP (cm)                   TRANSVERSE (cm)  Proximal             3.43 cm                   2.97 cm  Mid                  4.85 cm                   4.84 cm  Distal               2.93 cm                   2.78 cm  Right Iliac          1.56 cm                   1.40 cm  Left Iliac           1.35 cm                   1.43 cm   PREVIOUS:  Date:  01/24/2009  AP:  4.75  TRANSVERSE:  4.86   IMPRESSION:  1. Stable abdominal aortic aneurysm with largest measurement of 4.85      cm x 4.84 cm.  2. Mural thrombus noted with part of it appearing to move back and      forth within aneurysm.   ___________________________________________  Di Kindle. Edilia Bo, M.D.   AS/MEDQ  D:  08/31/2009  T:  08/31/2009  Job:  [id number]

## 2011-04-23 NOTE — Procedures (Signed)
DUPLEX ULTRASOUND OF ABDOMINAL AORTA   INDICATION:  Followup, abdominal aortic aneurysm.   HISTORY:  Diabetes:  No.  Cardiac:  No.  Hypertension:  Yes.  Smoking:  No.  Connective Tissue Disorder:  Family History:  Previous Surgery:   DUPLEX EXAM:         AP (cm)                   TRANSVERSE (cm)  Proximal             2.52 cm                   2.70 cm  Mid                  4.75 cm                   4.78 cm  Distal               3.58 cm                   3.18 cm  Right Iliac          1.70 cm                   1.78 cm  Left Iliac           1.74 cm                   1.75 cm   PREVIOUS:  Date:  AP:  4.56  TRANSVERSE:  4.54   IMPRESSION:  Abdominal aortic aneurysm noted with the largest  measurements of 4.75 cm X 4.78 cm.   ___________________________________________  Di Kindle. Edilia Bo, M.D.   MG/MEDQ  D:  07/19/2008  T:  07/19/2008  Job:  [id number]

## 2011-04-23 NOTE — Assessment & Plan Note (Signed)
OFFICE VISIT   Russell Wagner, Russell Wagner  DOB:  03/15/1941                                       08/31/2009  ZOXWR#:[id number]   I saw the patient in the office today for continued followup of his  abdominal aortic aneurysm.  This is a pleasant 70 year old gentleman who  I have been following with an aneurysm since August of 2007 when it was  4.3 cm in maximum diameter.  I last saw him in February of this year and  it was 4.85 cm.  He comes in for a 6 month followup visit.  Since I saw  him last he has had no abdominal or back pain.  He has had no leg pain,  claudication or rest pain.   REVIEW OF SYSTEMS:  He has had no chest pain, chest pressure,  palpitations or arrhythmias.  He has had no productive cough,  bronchitis, asthma or wheezing.  He is not a smoker.   PAST MEDICAL HISTORY:  Significant for hypertension and  hypercholesterolemia.   PHYSICAL EXAMINATION:  This is a pleasant 70 year old gentleman who  appears his stated age.  Blood pressure is 147/100, heart rate is 89.  Neck is supple.  I do not detect any carotid bruits.  Lungs are clear  bilaterally to auscultation.  On cardiac exam he has a regular rate and  rhythm.  His abdomen is soft and nontender.  His aneurysm is palpable  and nontender.  He has palpable femoral pulses and palpable posterior  tibial pulses bilaterally.  I do not see any evidence of atheroembolic  disease.   His duplex scan today shows that the maximum diameter of his aneurysm is  4.85 cm.  Thus it has not changed in size over the last 6 months.  However, I watched the duplex scan with Mardelle Matte today in the office and  there does appear to be some mobile thrombus within the infrarenal  aorta.   Although the aneurysm has not changed in size I am somewhat concerned  about the mobile thrombus and for this reason I have recommended a CT  angio of the abdomen and pelvis to further assess this.  If he has a  large mobile thrombus  there that looks like it is at high risk for  embolization then perhaps we should consider elective repair of his  aneurysm sooner than later.  We have discussed the risk of embolization  and the potential symptoms he would develop if he developed an ischemic  leg.  We will arrange for his CT scan and I will see him back after to  discuss these results.   Di Kindle. Edilia Bo, M.D.  Electronically Signed   CSD/MEDQ  D:  08/31/2009  T:  09/01/2009  Job:  [id number]

## 2011-04-23 NOTE — H&P (Signed)
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   HISTORY:  This is a pleasant 70 year old gentleman who I have been  following with an abdominal aortic aneurysm since August of 2007 when it  measured 4.3 cm in maximum diameter.  In September this year the  aneurysm measured 4.85 cm in maximum diameter.  However, although the  aneurysm had not changed significantly in size ultrasound noted some  mobile thrombus within the infrarenal aorta and for this reason a CT  scan was performed.  The CT showed that the aneurysm was actually 5.3 cm  in maximum diameter with significant laminated thrombus within the  aneurysm.  The iliacs were somewhat ectatic but not aneurysmal.  Given  the significant enlargement of the aneurysm since his most recent visit  and the mobile thrombus an elective repair has been recommended.   PAST MEDICAL HISTORY:  Significant for hypertension and  hypercholesterolemia.  He denies any history of diabetes, history of  previous myocardial infarction, history of congestive heart failure or  history of COPD.   PAST SURGICAL HISTORY:  Significant for previous hernia repair, ankle  surgery and knee replacement.   FAMILY HISTORY:  There is no history of premature cardiovascular  disease.  He does have two first cousins who had abdominal aortic  aneurysms.   SOCIAL HISTORY:  He is married and has one child.  He does not use  tobacco.   ALLERGIES:  Are penicillin.   MEDICATIONS:  1. Diovan/HCTZ 320/12.5 mg p.o. daily.  2. Crestor 10 mg p.o. daily.  3. Voltaren 75 mg p.o. daily.   REVIEW OF SYSTEMS:  GENERAL:  He has had no recent weight loss, weight  gain or problems with his appetite.  He is 234 pounds, 6 feet 2 inches  tall.  CARDIAC:  He has had no chest pain, chest pressure, palpitations or  arrhythmias.  He has had no history of atrial fibrillation.  PULMONARY:  He has had no productive cough, bronchitis, asthma or  wheezing.  GI:  He has had no change in his bowel habits and has no history of  peptic ulcer disease.  GU:  He has had no dysuria or frequency.  VASCULAR:  He denies claudication, rest pain or nonhealing ulcers.  He  has had no history of stroke, TIAs, expressive or receptive aphasia,  amaurosis fugax, history of DVT or phlebitis.  NEUROLOGICAL:  He has had no dizziness, blackouts, headaches or  seizures.  MUSCULOSKELETAL:  He does have a history of arthritis.  PSYCHIATRIC:  He has had no anxiety or depression.  ENT:  He has had no change in his eyesight or change in his hearing.  HEMATOLOGIC:  He has had no bleeding problems or clotting disorders.   PHYSICAL EXAMINATION:  General:  This is a pleasant 70 year old  gentleman who appears his stated age.  Vital signs:  His blood pressure  is 138/96, heart rate is 91, temperature is 98.  HEENT:  Pupils are  equal, round and reactive to light and accommodation.  Extraocular  motions are intact.  Conjunctivae are normal.  Neck:  Supple.  There is  no JVD.  He has no cervical lymphadenopathy.  I do not detect any  carotid bruits.  Lungs: Are clear bilaterally to  auscultation.  I do not  appreciate any rales, rhonchi or wheezing.  Cardiac:  He has a regular  rate and rhythm without murmurs appreciated.  He has no significant  peripheral edema.  Vascular:  Reveals palpable femoral, popliteal,  dorsalis pedis and posterior tibial pulses bilaterally.  He has no  evidence of atheroembolic disease.  Neurologic:  He has good strength in  the upper extremities and lower extremities bilaterally.  Skin:  He has  no rashes or ulcers.   I reviewed his CT scan which shows his diameter is 5.3 cm in maximum  diameter.  He has significant tortuosity of the neck.  The angulation at  the neck is greater than 60 degrees which would  be a contraindication to  endovascular repair and I think it would be difficult to get a good seal  proximally given the significant angulation both above and below the  renals of greater than 60 degrees.   The patient did undergo a nuclear stress test which was normal.  There  was no evidence of ischemia.  The patient was evaluated by Dr. Myrtis Ser  preoperatively and was felt to be an acceptable risk for open repair of  his abdominal aortic aneurysm.  He has normal LV function and no signs  of ischemia.   He is agreeable to proceed with an open repair of his 5.3 cm infrarenal  abdominal aortic aneurysm.  We have discussed the indications for the  procedure.  He understands the risk of rupture without repair is 5%-10%  per year.  We have discussed the potential complications of surgery  including but not limited to bleeding, wound healing problems, infection  including risk of graft infection, renal insufficiency, MI or other  unpredictable medical problems.  He understands the risk of mortality or  major morbidity is 3%-4%.  All of his questions were answered.  He is  agreeable to proceed.   Di Kindle. Edilia Bo, M.D.  Electronically Signed   CSD/MEDQ  D:  09/28/2009  T:  09/29/2009  Job:  [id number]   cc:   Ernestina Penna, M.D.  Luis Abed, MD, [facility] ([facility])

## 2011-04-23 NOTE — Procedures (Signed)
DUPLEX ULTRASOUND OF ABDOMINAL AORTA   INDICATION:  Followup, abdominal aortic aneurysm.   HISTORY:  Diabetes:  No.  Cardiac:  No.  Hypertension:  Yes.  Smoking:  No.  Connective Tissue Disorder:  Family History:  Previous Surgery:   DUPLEX EXAM:         AP (cm)                   TRANSVERSE (cm)  Proximal             2.69 cm                   2.66 cm  Mid                  4.56 cm                   4.54 cm  Distal               3.18 cm                   3.16 cm  Right Iliac          1.97 cm                   1.38 cm  Left Iliac           1.61 cm                   1.22 cm   PREVIOUS:  Date:  AP:  3.8  TRANSVERSE:  3.9   IMPRESSION:  1. Abdominal aortic aneurysm noted with the largest measurement of      4.56 cm X 4.54 cm.  2. Increase in size compared to previous study.   ___________________________________________  Di Kindle. Edilia Bo, M.D.   MG/MEDQ  D:  01/26/2008  T:  01/27/2008  Job:  [id number]

## 2011-04-23 NOTE — Assessment & Plan Note (Signed)
OFFICE VISIT   Russell Wagner, Russell Wagner  DOB:  [birth year]                                       01/24/2009  ZOXWR#:[id number]   I saw the patient in the office today for continued followup of his  abdominal aortic aneurysm.  I had originally seen him in August of 2007  when the aneurysm was 4.3 cm in maximum diameter.  On his most recent  followup visit the aneurysm was 4.78 cm in maximum diameter.  We have  been following him with ultrasound at 6 month intervals.  Since I saw  him last he has had no abdominal or back pain.  There has been no  significant change in his medical history.   SOCIAL HISTORY:  He is not a smoker.   REVIEW OF SYSTEMS:  He has had no recent chest pain, chest pressure,  palpitations or arrhythmias.  He has had no bronchitis, asthma or  wheezing.   PHYSICAL EXAMINATION:  General:  This is a pleasant 70 year old  gentleman who appears his stated age.  Vital signs:  Blood pressure is  158/101.  He states that generally his blood pressure runs lower than  this, however, usually is slightly elevated when he goes to the doctor's  office.  Neck:  Supple.  There is no cervical lymphadenopathy.  I do not  detect any carotid bruits.  Lungs:  Clear bilaterally to auscultation.  Cardiac:  He has a regular rate and rhythm.  Abdomen:  Soft and  nontender.  His aneurysm is palpable and nontender.  He has palpable  femoral pulses with warm, well-perfused feet.  He has no significant  lower extremity swelling.   Ultrasound in our office today shows that the maximum diameter of the  aneurysm is 4.86 cm thus there has been no significant change in his  aneurysm over the last 6 months.  He understands we generally would not  consider elective repair unless the aneurysm reached 5.5 cm in maximum  diameter.  I have recommended a followup ultrasound in 6 months and I  will see him back at that time.  He did have some questions today about  open versus  endovascular repair of his aneurysm and we discussed these  in the office today.  I explained to him that if his aneurysm enlarged  and we were considering repair we would probably get a CT scan to help  further assess his options for repair.  However, currently the aneurysm  has not changed significantly and is well away from 5.5 cm.   Di Kindle. Edilia Bo, M.D.  Electronically Signed   CSD/MEDQ  D:  01/24/2009  T:  01/25/2009  Job:  [id number]

## 2011-04-26 NOTE — Discharge Summary (Signed)
NAME:  Russell Wagner, Russell Wagner NO.:  [id number]   MEDICAL RECORD NO.:  [id number]          PATIENT TYPE:  INP   LOCATION:  1612                         FACILITY:  [facility]   PHYSICIAN:  Erasmo Leventhal, M.D.DATE OF BIRTH:  [birth year]   DATE OF ADMISSION:  08/08/2006  DATE OF DISCHARGE:                                 DISCHARGE SUMMARY   ADMITTING DIAGNOSIS:  End-stage osteoarthritis, left knee.   DISCHARGE DIAGNOSIS:  End-stage osteoarthritis, left knee.   OPERATION:  Total knee arthroplasty, left knee.   HISTORY OF PRESENT ILLNESS:  This is a 70 year old gentleman with history of  end-stage osteoarthritis of the left knee that failed conservative  management.  After discussion of treatment, risks, benefits, risks and  options, the patient now scheduled for total knee arthroplasty.  He had  medical clearance by his medical doctor, Dr. Kathleen Lime and surgical go  ahead is scheduled.   LABORATORY DATA:  Admission CBC within normal limits.  Admission CMET  within normal limits with the exception of an elevated glucose at 151.  Admission PT within normal limits.  Admission PTT within normal limits.  Hemoglobin and hematocrit reached a low of 9.2 and 26.0 on September 3.  He  was mildly hyponatremic, and hypokalemic on September 2 and was treated with  fluid correction, oral potassium, and on September 3, hyponatremia and  hypokalemia corrected.  His glucose did run mildly elevated still at 138.  INR on discharge was 1.9.   HOSPITAL COURSE:  The patient tolerated the operative procedure well.  First  postoperative day vital signs were stable.  Hemovac was discontinued intact.  Neurovascular he was intact.  Physical therapy was started.  On second  postoperative day, vital signs stable, afebrile.  Dressing was changed.  Wound benign.  Calves were negative.  Wounds clear.  He was noted to be  mildly hypokalemic and hyponatremic and was given p.o. potassium, fluid  correction, and this corrected.  The third postoperative day, vital signs  were stable.  He was feeling good.  Hemoglobin 9.2.  Hematocrit 26.0.  INR  2.0.  Lungs clear.  Heart sounds normal.  Bowel sounds active.  Calves  negative and wound benign.  Discharge planning was made for the a.m. on  08/12/06 with vital signs stable, afebrile, wound benign, calves negative.  The patient is subsequently discharged home for follow up in the office.   CONDITION ON DISCHARGE:  Improved.   DISCHARGE MEDICATIONS:  Percocet 1-2 q4-6 h. p.r.n. pain.  Robaxin 500, 1  p.o. q8h p.r.n. spasm.  __________  p.o. b.i.d. for anemia and Coumadin per  the pharmacy.   DISCHARGE INSTRUCTIONS:  He is to follow a __________ diet, do his exercises  at home.  Call the office for an appointment in 2 weeks or call sooner  p.r.n. problems.      Jaquelyn Bitter. Chabon, P.A.    ______________________________  Erasmo Leventhal, M.D.    SJC/MEDQ  D:  08/12/2006  T:  08/12/2006  Job:  [id number]

## 2011-04-26 NOTE — Op Note (Signed)
NAME:  Russell, Wagner NO.:  [id number]   MEDICAL RECORD NO.:  [id number]          PATIENT TYPE:  INP   LOCATION:  0005                         FACILITY:  [facility]   PHYSICIAN:  Erasmo Leventhal, M.D.DATE OF BIRTH:  May 02, 1941   DATE OF PROCEDURE:  08/08/2006  DATE OF DISCHARGE:                                 OPERATIVE REPORT   PREOPERATIVE DIAGNOSIS:  Left knee osteoarthritis.   POSTOPERATIVE DIAGNOSIS:  Left knee osteoarthritis.   PROCEDURE:  Left total knee arthroplasty.   SURGEON:  Erasmo Leventhal, M.D.   ASSISTANT:  Jaquelyn Bitter. Chabon, P.A.   ANESTHESIA:  Spinal.   ESTIMATED BLOOD LOSS:  Less than 50 mL.   DRAINS:  Two medium Hemovac.   COMPLICATIONS:  None.   TOURNIQUET TIME:  1 hours 30 minutes at 300 mmHg.   OPERATIVE DETAILS:  The patient was counseled in the holding area and the  correct side was identified, IV started and antibiotics given.  Taken to the  OR, where the spinal anesthetic was administered.  A Foley catheter was  placed utilizing sterile technique by the OR circulating nurse.  All  extremities were well-padded and bumped.  The left lower extremity was  examined.  A 5 degree flexion contracture, flexion to 120 degrees.  Elevated, prepped with DuraPrep and draped in a sterile fashion,  exsanguinated with an Esmarch and the tourniquet was inflated to 300 mmHg.  A straight midline incision was made through the skin and subcutaneous  tissue, medial and lateral soft tissue flaps developed, medial parapatellar  arthrotomy was performed.  The patella was retracted at times and everted at  times based upon the exposure, bone-against-bone contact, end-stage  arthritis.  Osteophytes were removed and the PCL was excised.  There was no  apparent ACL.  A starting hole was made in the distal the femur, the canal  was irrigated until the effluent was clear and the extramedullary rod was  placed.  I took initially a 5 degree valgus  cut, then took a 10 mm cut off  the distal femur.  The distal femur was found to be a size 5, rotation marks  made, cut to fit a #5.  Ten millimeters was resected, medial and lateral  menisci were removed under direct visualization.  Geniculate vessels were  coagulated.  Posterior neurovascular structures were __________ and  protected throughout the entire case.  The proximal tibia was found to be a  size 5.  A central hole was made.  The reamer was utilized, step reamer.  The canal was irrigated until the effluent was clear.  The intramedullary  rod was gently placed.  Initially chose a 10 mm cut based upon the lateral  side, which was the least deficient and had a 0 degree slope.  This was  done.  Posteromedial, posterolateral femoral osteophytes were removed.  At  this time with flexion-extension blocks, we were well-balanced in flexion 10  mm but we were tight in extension.  Based upon that attention was directed  to the distal femur, another 10 mm cut off the distal femur, and  again it  was reshaped.  At this time we had a well-balanced knee in flexion and  extension with 10 mm blocks.  The proximal tibia was exposed, the tibial  base plate was applied, rotation __________ set, reamer and punch.  The  femoral box was cut was prepared in standard fashion.  Again posterior  neurovascular structures were __________ throughout the entire case.  At  this time with a size 5 tibia, size 5 femur, with a 10 insert, with  excellent range of motion and soft tissue balance.  The patella was found to  be a size 38, appropriate amount of bone was resected and locking holes were  made.  Patellofemoral tracking was anatomic.  Trials were then removed.  The  knee was copiously irrigated with pulsatile lavage.  Utilizing Modern cement  technique, all components were cemented into place, a size 5 tibia, size 5  femur, with a 38 patella.  After the cement had cured we did 10 and 12.5 mm  inserts.  With  the 12.5 mm tibial insert, we had excellent range of motion  and soft tissue balance, stable to varus and valgus stress, 0 to 30 degrees  of flexion, patellofemoral tracking was anatomic.  After we did trials, the  excess cement was removed, the knee was irrigated with pulsatile lavage  again, and we did a 10 and 12.5 mm insert.  With the 12.5 mm tibial insert  we had full extension, flexion to 120 degrees.  Elevated and draped in a  sterile fashion, stable to varus and valgus stress, 0-30 degrees of flexion,  then up to 90 degrees, well-balanced in flexion and extension and excellent  alignment, and patellofemoral tracking was anatomic.  The trial was then  removed and we implanted a 12.5 mm rotating platform posterior stabilized  tibial insert.  The knee was again checked.  Excellent range of motion, soft  tissue balance and alignment.  Patellofemoral tracking was anatomic.  Two  medium Hemovac drains were then placed.  Sequential closure in layers was  done, the arthrotomy closed with 90 degrees of flexion, subcu Vicryl, skin  closed with subcuticular Monocryl sutures, Steri-Strips applied, drains  hooked to suction, sterile dressing applied to the knee, the tourniquet was  deflated with normal circulation to the foot and ankle.  At the end of the  case another gram of Ancef was given intravenously.  The patient tolerated  the procedure well.  There were no complications or complications, and then  he we gently wakened.  He was taken from the operating room awake, in stable  condition.   Surgeon Erasmo Leventhal, M.D., assistant Jaquelyn Bitter. Chabon, P.A.  To  decrease surgical time, help with surgical retraction and decision-making,  Jaquelyn Bitter. Jannette Spanner, P.A., was needed.           ______________________________  Erasmo Leventhal, M.D.     RAC/MEDQ  D:  08/08/2006  T:  08/09/2006  Job:  [id number]

## 2011-04-26 NOTE — H&P (Signed)
NAME:  Russell Wagner, Russell Wagner NO.:  [id number]   MEDICAL RECORD NO.:  [id number]          PATIENT TYPE:   LOCATION:                                 FACILITY:   PHYSICIAN:  Erasmo Leventhal, M.D.DATE OF BIRTH:  December 22, 1940   DATE OF ADMISSION:  DATE OF DISCHARGE:                                HISTORY & PHYSICAL   CHIEF COMPLAINT:  Osteoarthritis, left knee.   HISTORY OF PRESENT ILLNESS:  This is a 70 year old gentleman with a history  of endstage osteoarthritis of the left knee that has failed conservative  management.  He has problems with activities of daily living, pain and  difficulty with ambulation, and after discussion of treatment and benefits,  risks and options, the patient is scheduled for total knee arthroplasty of  the left knee.  His medical doctor is Dr. Christell Constant in [place], and he has  received medical clearance.   PAST MEDICAL HISTORY:  DRUG ALLERGY TO PENICILLIN WITH SWELLING IN THE ARMS.   CURRENT MEDICATIONS:  1. Diovan 160/12.5, one q.d.  2. Voltaren p.r.n.  He is advised to stop that prior to surgery, one week      prior.   PREVIOUS SURGERY:  Include right leg surgery with a ankle fusion and  herniorrhaphy.   SERIOUS MEDICAL ILLNESSES:  Include hypertension and a history of a small  abdominal aortic aneurysm evaluated by the CVTS surgeons and cleared for  surgery by patient report.   PAST FAMILY HISTORY:  Hypertension.   SOCIAL HISTORY:  The patient is married.  He is retired.  He smokes rarely  and does not drink.   REVIEW OF SYSTEMS:  Negative for blurred vision or dizziness. PULMONARY:  Shortness of breath, PND or orthopnea.  CARDIOVASCULAR:  Negative for chest  pain or palpitations.  GI:  Negative for ulcers or hepatitis.  GU:  Negative  for urinary tract difficulty.  MUSCULOSKELETAL:  Positive in HPI.   PHYSICAL EXAMINATION:  VITAL SIGNS:  BP 150/96, respirations 18, pulse 78  and regular.  GENERAL APPEARANCE:  This is a  well-developed, well-nourished gentleman in  no acute distress.  HEENT:  Head normocephalic.  Nose patent, ears patent.  Pupils equal, round,  reactive to light.  Throat without injection.  NECK:  Supple without adenopathy.  Carotids 2+ without bruits.  CHEST:  Clear to auscultation.  No rales or rhonchi, respirations 18.  HEART:  Regular rate and rhythm at 78 beats per minutes without murmur.  ABDOMEN:  Soft with active bowel sounds.  No masses or organomegaly.  No  bruits.  NEUROLOGIC:  Patient alert and oriented to time, place and person.  Cranial  nerves 2-12 grossly intact.  EXTREMITIES:  Show a left knee with a varus deformity and 3 degrees of  flexion contracture with further flexion of 135 degrees.  Dorsalis pedis, posterior tibialis pulses are 2+.   X-ray shows endstage osteoarthritis of the left knee.   IMPRESSION:  Endstage osteoarthritis left knee.   PLAN:  Total knee arthroplasty, left knee.      Jaquelyn Bitter. Chabon, P.A.    ______________________________  Erasmo Leventhal, M.D.    SJC/MEDQ  D:  07/30/2006  T:  07/30/2006  Job:  [id number]

## 2011-05-09 ENCOUNTER — Encounter: Payer: Self-pay | Admitting: Physician Assistant

## 2012-04-30 ENCOUNTER — Ambulatory Visit: Payer: Medicare Other | Attending: Family Medicine | Admitting: Physical Therapy

## 2012-04-30 DIAGNOSIS — IMO0001 Reserved for inherently not codable concepts without codable children: Secondary | ICD-10-CM | POA: Insufficient documentation

## 2012-04-30 DIAGNOSIS — R5381 Other malaise: Secondary | ICD-10-CM | POA: Insufficient documentation

## 2012-04-30 DIAGNOSIS — M6281 Muscle weakness (generalized): Secondary | ICD-10-CM | POA: Insufficient documentation

## 2012-05-05 ENCOUNTER — Ambulatory Visit: Payer: Medicare Other | Admitting: Physical Therapy

## 2012-05-07 ENCOUNTER — Ambulatory Visit: Payer: Medicare Other | Admitting: Physical Therapy

## 2012-05-12 ENCOUNTER — Ambulatory Visit: Payer: Medicare Other | Attending: Family Medicine | Admitting: *Deleted

## 2012-05-12 DIAGNOSIS — IMO0001 Reserved for inherently not codable concepts without codable children: Secondary | ICD-10-CM | POA: Insufficient documentation

## 2012-05-12 DIAGNOSIS — M6281 Muscle weakness (generalized): Secondary | ICD-10-CM | POA: Insufficient documentation

## 2012-05-12 DIAGNOSIS — R5381 Other malaise: Secondary | ICD-10-CM | POA: Insufficient documentation

## 2012-05-14 ENCOUNTER — Ambulatory Visit: Payer: Medicare Other | Admitting: *Deleted

## 2013-04-27 ENCOUNTER — Other Ambulatory Visit: Payer: Self-pay | Admitting: Nurse Practitioner

## 2013-07-28 ENCOUNTER — Ambulatory Visit: Payer: Self-pay | Admitting: Nurse Practitioner

## 2013-10-29 ENCOUNTER — Other Ambulatory Visit: Payer: Self-pay | Admitting: *Deleted

## 2013-10-29 NOTE — Telephone Encounter (Signed)
Patient has not been seen since we started Epic. Please advise

## 2013-10-31 MED ORDER — DICLOFENAC SODIUM 75 MG PO TBEC: DELAYED_RELEASE_TABLET | ORAL | Status: AC

## 2013-10-31 MED ORDER — VALSARTAN-HYDROCHLOROTHIAZIDE 320-12.5 MG PO TABS: 1.0000 | ORAL_TABLET | Freq: Every day | ORAL | Status: DC

## 2013-11-30 ENCOUNTER — Other Ambulatory Visit: Payer: Self-pay

## 2013-11-30 MED ORDER — VALSARTAN-HYDROCHLOROTHIAZIDE 320-12.5 MG PO TABS: 1.0000 | ORAL_TABLET | Freq: Every day | ORAL | Status: AC

## 2013-11-30 NOTE — Telephone Encounter (Signed)
Last seen 02/16/13  ACm

## 2013-12-28 ENCOUNTER — Other Ambulatory Visit: Payer: Self-pay

## 2014-12-29 DIAGNOSIS — M4807 Spinal stenosis, lumbosacral region: Secondary | ICD-10-CM | POA: Diagnosis not present

## 2014-12-29 DIAGNOSIS — G894 Chronic pain syndrome: Secondary | ICD-10-CM | POA: Diagnosis not present

## 2014-12-29 DIAGNOSIS — M5137 Other intervertebral disc degeneration, lumbosacral region: Secondary | ICD-10-CM | POA: Diagnosis not present

## 2014-12-29 DIAGNOSIS — M545 Low back pain: Secondary | ICD-10-CM | POA: Diagnosis not present

## 2015-03-13 DIAGNOSIS — Z471 Aftercare following joint replacement surgery: Secondary | ICD-10-CM | POA: Diagnosis not present

## 2015-03-13 DIAGNOSIS — Z96652 Presence of left artificial knee joint: Secondary | ICD-10-CM | POA: Diagnosis not present

## 2015-03-13 DIAGNOSIS — M1711 Unilateral primary osteoarthritis, right knee: Secondary | ICD-10-CM | POA: Diagnosis not present

## 2015-03-31 DIAGNOSIS — M179 Osteoarthritis of knee, unspecified: Secondary | ICD-10-CM | POA: Diagnosis not present

## 2015-03-31 DIAGNOSIS — I10 Essential (primary) hypertension: Secondary | ICD-10-CM | POA: Diagnosis not present

## 2015-03-31 DIAGNOSIS — D649 Anemia, unspecified: Secondary | ICD-10-CM | POA: Diagnosis not present

## 2015-03-31 DIAGNOSIS — E785 Hyperlipidemia, unspecified: Secondary | ICD-10-CM | POA: Diagnosis not present

## 2015-04-24 NOTE — H&P (Signed)
TOTAL KNEE ADMISSION H&P  Patient is being admitted for right total knee arthroplasty.  Subjective:  Chief Complaint:right knee pain.  HPI: Russell Wagner, 74 y.o. male, has a history of pain and functional disability in the right knee due to arthritis and has failed non-surgical conservative treatments for greater than 12 weeks to includeNSAID's and/or analgesics, flexibility and strengthening excercises, use of assistive devices and activity modification.  Onset of symptoms was gradual, starting 5 years ago with gradually worsening course since that time. The patient noted prior procedures on the knee to include  arthroplasty on the left knee(s).  Patient currently rates pain in the right knee(s) at 6 out of 10 with activity. Patient has worsening of pain with activity and weight bearing, pain that interferes with activities of daily living, pain with passive range of motion and joint swelling.  Patient has evidence of subchondral sclerosis, periarticular osteophytes and joint space narrowing by imaging studies. This patient has had osteoarthritis. There is no active infection.  Patient Active Problem List   Diagnosis Date Noted  . Hyperlipidemia 03/20/2011  . HTN (hypertension) 03/20/2011  . Allergic rhinitis, cause unspecified 03/20/2011  . Osteoarthritis 03/20/2011  . HYPERTENSION 09/26/2009  . ABDOMINAL AORTIC ANEURYSM 09/25/2009   No past medical history on file.  No past surgical history on file.  No prescriptions prior to admission   Allergies  Allergen Reactions  . Penicillins     REACTION: Arm swelling    History  Substance Use Topics  . Smoking status: Not on file  . Smokeless tobacco: Not on file  . Alcohol Use: Not on file    No family history on file.   Review of Systems  Constitutional: Negative.   HENT: Negative.   Eyes: Negative.   Respiratory: Negative.   Cardiovascular: Negative.   Gastrointestinal: Negative.   Genitourinary: Negative.    Musculoskeletal: Positive for back pain and joint pain.  Skin: Negative.   Neurological: Negative.   Endo/Heme/Allergies: Negative.   Psychiatric/Behavioral: Negative.     Objective:  Physical Exam  Constitutional: He is oriented to person, place, and time. He appears well-developed.  HENT:  Head: Normocephalic.  Eyes: EOM are normal.  Neck: Normal range of motion.  Cardiovascular: Normal rate and intact distal pulses.   Respiratory: Effort normal.  GI: Soft.  Genitourinary:  Deferred  Musculoskeletal:  Right knee pain. Calf soft and non tender. RLE grossly N/V intact.  Neurological: He is alert and oriented to person, place, and time. He has normal reflexes.  Skin: Skin is warm and dry.  Psychiatric: His behavior is normal.    Vital signs in last 24 hours:    Labs:   Estimated body mass index is 30.10 kg/(m^2) as calculated from the following:   Height as of 09/26/09: 6\' 2"  (1.88 m).   Weight as of 09/26/09: 106.369 kg (234 lb 8 oz).   Imaging Review Plain radiographs demonstrate severe degenerative joint disease of the right knee(s). The overall alignment ismild varus. The bone quality appears to be good for age and reported activity level.  Assessment/Plan:  End stage arthritis, left knee   The patient history, physical examination, clinical judgment of the provider and imaging studies are consistent with end stage degenerative joint disease of the left knee(s) and total knee arthroplasty is deemed medically necessary. The treatment options including medical management, injection therapy arthroscopy and arthroplasty were discussed at length. The risks and benefits of total knee arthroplasty were presented and reviewed. The risks due  to aseptic loosening, infection, stiffness, patella tracking problems, thromboembolic complications and other imponderables were discussed. The patient acknowledged the explanation, agreed to proceed with the plan and consent was signed.  Patient is being admitted for inpatient treatment for surgery, pain control, PT, OT, prophylactic antibiotics, VTE prophylaxis, progressive ambulation and ADL's and discharge planning. The patient is planning to be discharged home with home health services   .

## 2015-04-26 NOTE — Progress Notes (Signed)
Surgery on 05/12/15.  Need orders in EPIc.  Thank You.

## 2015-04-28 ENCOUNTER — Other Ambulatory Visit: Payer: Self-pay | Admitting: Orthopedic Surgery

## 2015-05-05 NOTE — Progress Notes (Signed)
Consent orders need clarification indication and procedure needs to be reviewed. Thanks.

## 2015-05-09 ENCOUNTER — Other Ambulatory Visit: Payer: Self-pay | Admitting: Orthopedic Surgery

## 2015-05-10 ENCOUNTER — Encounter (HOSPITAL_COMMUNITY)
Admission: RE | Admit: 2015-05-10 | Discharge: 2015-05-10 | Disposition: A | Discharge status: Home / Self Care | Payer: Medicare Other | Source: Ambulatory Visit | Attending: Specialist | Admitting: Specialist

## 2015-05-10 ENCOUNTER — Encounter (HOSPITAL_COMMUNITY): Payer: Self-pay

## 2015-05-10 DIAGNOSIS — I10 Essential (primary) hypertension: Secondary | ICD-10-CM | POA: Diagnosis not present

## 2015-05-10 DIAGNOSIS — M1711 Unilateral primary osteoarthritis, right knee: Secondary | ICD-10-CM | POA: Diagnosis not present

## 2015-05-10 DIAGNOSIS — E785 Hyperlipidemia, unspecified: Secondary | ICD-10-CM | POA: Diagnosis not present

## 2015-05-10 DIAGNOSIS — Z01812 Encounter for preprocedural laboratory examination: Secondary | ICD-10-CM | POA: Diagnosis not present

## 2015-05-10 HISTORY — DX: Unspecified osteoarthritis, unspecified site: M19.90

## 2015-05-10 HISTORY — DX: Pneumonia, unspecified organism: J18.9

## 2015-05-10 HISTORY — DX: Essential (primary) hypertension: I10

## 2015-05-10 HISTORY — DX: Allergy, unspecified, initial encounter: T78.40XA

## 2015-05-10 LAB — BASIC METABOLIC PANEL
Anion gap: 9 (ref 5–15)
BUN: 24 mg/dL — AB (ref 6–20)
CALCIUM: 9.2 mg/dL (ref 8.9–10.3)
CO2: 28 mmol/L (ref 22–32)
CREATININE: 1.07 mg/dL (ref 0.61–1.24)
Chloride: 103 mmol/L (ref 101–111)
GFR calc Af Amer: >60 mL/min (ref >60)
GFR calc non Af Amer: >60 mL/min (ref >60)
Glucose, Bld: 120 mg/dL — ABNORMAL HIGH (ref 65–99)
Potassium: 3.8 mmol/L (ref 3.5–5.1)
SODIUM: 140 mmol/L (ref 135–145)

## 2015-05-10 LAB — URINALYSIS, ROUTINE W REFLEX MICROSCOPIC
Bilirubin Urine: NEGATIVE
Glucose, UA: NEGATIVE mg/dL
HGB URINE DIPSTICK: NEGATIVE
Ketones, ur: NEGATIVE mg/dL
LEUKOCYTES UA: NEGATIVE
Nitrite: NEGATIVE
Protein, ur: NEGATIVE mg/dL
Specific Gravity, Urine: 1.024 (ref 1.005–1.030)
Urobilinogen, UA: 0.2 mg/dL (ref 0.0–1.0)
pH: 6 (ref 5.0–8.0)

## 2015-05-10 LAB — SURGICAL PCR SCREEN
MRSA, PCR: NEGATIVE
Staphylococcus aureus: POSITIVE — AB

## 2015-05-10 LAB — CBC
HEMATOCRIT: 42.2 % (ref 39.0–52.0)
HEMOGLOBIN: 13.6 g/dL (ref 13.0–17.0)
MCH: 31 pg (ref 26.0–34.0)
MCHC: 32.2 g/dL (ref 30.0–36.0)
MCV: 96.1 fL (ref 78.0–100.0)
PLATELETS: 215 10*3/uL (ref 150–400)
RBC: 4.39 MIL/uL (ref 4.22–5.81)
RDW: 13.7 % (ref 11.5–15.5)
WBC: 8.2 10*3/uL (ref 4.0–10.5)

## 2015-05-10 LAB — PROTIME-INR
INR: 1.01 (ref 0.00–1.49)
Prothrombin Time: 13.5 seconds (ref 11.6–15.2)

## 2015-05-10 LAB — APTT: aPTT: 30 seconds (ref 24–37)

## 2015-05-10 NOTE — Patient Instructions (Addendum)
PARAM CAPRI  05/10/2015   Your procedure is scheduled on: Friday May 12, 2015   Report to [facility] Main  Entrance and follow signs to               Short Stay Center arrive at 11:00 AM.  Call this number if you have problems the morning of surgery [phone]   Remember: ONLY 1 PERSON MAY GO WITH YOU TO SHORT STAY TO GET  READY MORNING OF YOUR SURGERY.  Do not eat food After Midnight but may take clear liquids till 7:00 am day of surgery then nothing by mouth.      Take these medicines the morning of surgery with A SIP OF WATER: NONE                               You may not have any metal on your body including hair pins and              piercings  Do not wear jewelry, colognes, lotions, powders or  deodorant                        Men may shave face and neck.   Do not bring valuables to the hospital. [facility] IS NOT             RESPONSIBLE   FOR VALUABLES.  Contacts, dentures or bridgework may not be worn into surgery.  Leave suitcase in the car. After surgery it may be brought to your room.                  Please read over the following fact sheets you were given:[facility]SA FACT SHEET;INCENTIVE SPIROMETRY; BLOOD FACT SHEET _____________________________________________________________________               Incentive Spirometer  An incentive spirometer is a tool that can help keep your lungs clear and active. This tool measures how well you are filling your lungs with each breath. Taking long deep breaths may help reverse or decrease the chance of developing breathing (pulmonary) problems (especially infection) following:  A long period of time when you are unable to move or be active. BEFORE THE PROCEDURE   If the spirometer includes an indicator to show your best effort, your nurse or respiratory therapist will set it to a desired goal.  If possible, sit up straight or lean slightly forward. Try not to slouch.  Hold the incentive spirometer  in an upright position. INSTRUCTIONS FOR USE   Sit on the edge of your bed if possible, or sit up as far as you can in bed or on a chair.  Hold the incentive spirometer in an upright position.  Breathe out normally.  Place the mouthpiece in your mouth and seal your lips tightly around it.  Breathe in slowly and as deeply as possible, raising the piston or the ball toward the top of the column.  Hold your breath for 3-5 seconds or for as long as possible. Allow the piston or ball to fall to the bottom of the column.  Remove the mouthpiece from your mouth and breathe out normally.  Rest for a few seconds and repeat Steps 1 through 7 at least 10 times every 1-2 hours when you are awake. Take your time and take a few normal breaths  between deep breaths.  The spirometer may include an indicator to show your best effort. Use the indicator as a goal to work toward during each repetition.  After each set of 10 deep breaths, practice coughing to be sure your lungs are clear. If you have an incision (the cut made at the time of surgery), support your incision when coughing by placing a pillow or rolled up towels firmly against it. Once you are able to get out of bed, walk around indoors and cough well. You may stop using the incentive spirometer when instructed by your caregiver.  RISKS AND COMPLICATIONS  Take your time so you do not get dizzy or light-headed.  If you are in pain, you may need to take or ask for pain medication before doing incentive spirometry. It is harder to take a deep breath if you are having pain. AFTER USE  Rest and breathe slowly and easily.  It can be helpful to keep track of a log of your progress. Your caregiver can provide you with a simple table to help with this. If you are using the spirometer at home, follow these instructions: SEEK MEDICAL CARE IF:   You are having difficultly using the spirometer.  You have trouble using the spirometer as often as  instructed.  Your pain medication is not giving enough relief while using the spirometer.  You develop fever of 100.5 F (38.1 C) or higher. SEEK IMMEDIATE MEDICAL CARE IF:   You cough up bloody sputum that had not been present before.  You develop fever of 102 F (38.9 C) or greater.  You develop worsening pain at or near the incision site. MAKE SURE YOU:   Understand these instructions.  Will watch your condition.  Will get help right away if you are not doing well or get worse. Document Released: 04/07/2007 Document Revised: 02/17/2012 Document Reviewed: 06/08/2007 ExitCare Patient Information 2014 [employer], [place].   ________________________________________________________________________  WHAT IS A BLOOD TRANSFUSION? Blood Transfusion Information  A transfusion is the replacement of blood or some of its parts. Blood is made up of multiple cells which provide different functions.  Red blood cells carry oxygen and are used for blood loss replacement.  White blood cells fight against infection.  Platelets control bleeding.  Plasma helps clot blood.  Other blood products are available for specialized needs, such as hemophilia or other clotting disorders. BEFORE THE TRANSFUSION  Who gives blood for transfusions?   Healthy volunteers who are fully evaluated to make sure their blood is safe. This is blood bank blood. Transfusion therapy is the safest it has ever been in the practice of medicine. Before blood is taken from a donor, a complete history is taken to make sure that person has no history of diseases nor engages in risky social behavior (examples are intravenous drug use or sexual activity with multiple partners). The donor's travel history is screened to minimize risk of transmitting infections, such as malaria. The donated blood is tested for signs of infectious diseases, such as HIV and hepatitis. The blood is then tested to be sure it is compatible with you in  order to minimize the chance of a transfusion reaction. If you or a relative donates blood, this is often done in anticipation of surgery and is not appropriate for emergency situations. It takes many days to process the donated blood. RISKS AND COMPLICATIONS Although transfusion therapy is very safe and saves many lives, the main dangers of transfusion include:   Getting an infectious  disease.  Developing a transfusion reaction. This is an allergic reaction to something in the blood you were given. Every precaution is taken to prevent this. The decision to have a blood transfusion has been considered carefully by your caregiver before blood is given. Blood is not given unless the benefits outweigh the risks. AFTER THE TRANSFUSION  Right after receiving a blood transfusion, you will usually feel much better and more energetic. This is especially true if your red blood cells have gotten low (anemic). The transfusion raises the level of the red blood cells which carry oxygen, and this usually causes an energy increase.  The nurse administering the transfusion will monitor you carefully for complications. HOME CARE INSTRUCTIONS  No special instructions are needed after a transfusion. You may find your energy is better. Speak with your caregiver about any limitations on activity for underlying diseases you may have. SEEK MEDICAL CARE IF:   Your condition is not improving after your transfusion.  You develop redness or irritation at the intravenous (IV) site. SEEK IMMEDIATE MEDICAL CARE IF:  Any of the following symptoms occur over the next 12 hours:  Shaking chills.  You have a temperature by mouth above 102 F (38.9 C), not controlled by medicine.  Chest, back, or muscle pain.  People around you feel you are not acting correctly or are confused.  Shortness of breath or difficulty breathing.  Dizziness and fainting.  You get a rash or develop hives.    CLEAR LIQUID DIET   Foods  Allowed                                                                     Foods Excluded  Coffee and tea, regular and decaf                             liquids that you cannot  Plain Jell-O in any flavor                                             see through such as: Fruit ices (not with fruit pulp)                                     milk, soups, orange juice  Iced Popsicles                                    All solid food Carbonated beverages, regular and diet                                    Cranberry, grape and apple juices Sports drinks like Gatorade Lightly seasoned clear broth or consume(fat free) Sugar, honey syrup  Sample Menu Breakfast  Lunch                                     Supper Cranberry juice                    Beef broth                            Chicken broth Jell-O                                     Grape juice                           Apple juice Coffee or tea                        Jell-O                                      Popsicle                                                Coffee or tea                        Coffee or tea  _____________________________________________________________________   You have a decrease in urine output.  Your urine turns a dark color or changes to pink, red, or brown. Any of the following symptoms occur over the next 10 days:  You have a temperature by mouth above 102 F (38.9 C), not controlled by medicine.  Shortness of breath.  Weakness after normal activity.  The white part of the eye turns yellow (jaundice).  You have a decrease in the amount of urine or are urinating less often.  Your urine turns a dark color or changes to pink, red, or brown. Document Released: 11/22/2000 Document Revised: 02/17/2012 Document Reviewed: 07/11/2008 ExitCare Patient Information 2014 [place] [employer].  _______________________________________________________________________[facility] -  Preparing for Surgery Before surgery, you can play an important role.  Because skin is not sterile, your skin needs to be as free of germs as possible.  You can reduce the number of germs on your skin by washing with CHG (chlorahexidine gluconate) soap before surgery.  CHG is an antiseptic cleaner which kills germs and bonds with the skin to continue killing germs even after washing. Please DO NOT use if you have an allergy to CHG or antibacterial soaps.  If your skin becomes reddened/irritated stop using the CHG and inform your nurse when you arrive at Short Stay. Do not shave (including legs and underarms) for at least 48 hours prior to the first CHG shower.  You may shave your face/neck. Please follow these instructions carefully:  1.  Shower with CHG Soap the night before surgery and the  morning of Surgery.  2.  If you choose to wash your hair, wash your hair first as usual with your  normal  shampoo.  3.  After you shampoo, rinse your hair and body  thoroughly to remove the  shampoo.                           4.  Use CHG as you would any other liquid soap.  You can apply chg directly  to the skin and wash                       Gently with a scrungie or clean washcloth.  5.  Apply the CHG Soap to your body ONLY FROM THE NECK DOWN.   Do not use on face/ open                           Wound or open sores. Avoid contact with eyes, ears mouth and genitals (private parts).                       Wash face,  Genitals (private parts) with your normal soap.             6.  Wash thoroughly, paying special attention to the area where your surgery  will be performed.  7.  Thoroughly rinse your body with warm water from the neck down.  8.  DO NOT shower/wash with your normal soap after using and rinsing off  the CHG Soap.                9.  Pat yourself dry with a clean towel.            10.  Wear clean pajamas.            11.  Place clean sheets on your bed the night of your first shower and do not  sleep  with pets. Day of Surgery : Do not apply any lotions/deodorants the morning of surgery.  Please wear clean clothes to the hospital/surgery center.  FAILURE TO FOLLOW THESE INSTRUCTIONS MAY RESULT IN THE CANCELLATION OF YOUR SURGERY PATIENT SIGNATURE_________________________________  NURSE SIGNATURE__________________________________  ________________________________________________________________________

## 2015-05-10 NOTE — Progress Notes (Signed)
Surgical screening results in epic per PAT visit 05/10/2015 positive for Staph. Results sent to Dr Carlynn Purl Collins. Pt is aware of prescription and instructions of use. Instructed to also bring med day of surgery. Mupriocin Ointment [place] to [facility] [place] spoke with Arlys JohnBrian (pharmacist).

## 2015-05-10 NOTE — Progress Notes (Addendum)
Clearance note per chart per  Helene KelpAndrew Maier 03/31/2015  Clearance note per chart per Dr Rubye Oaksickerson noting pt needing periop abx

## 2015-05-10 NOTE — Progress Notes (Signed)
Your patient has screened at an elevated risk for Obstructive Sleep Apnea using the Stop-Bang Tool during a pre-surgical vist. A score of 4 or greater is an elevated risk. Score of 4. 

## 2015-05-10 NOTE — Progress Notes (Signed)
BMP results in epic per PAT visit 05/10/2015 sent to Dr Carlynn Purl Collins

## 2015-05-12 ENCOUNTER — Inpatient Hospital Stay (HOSPITAL_COMMUNITY): Payer: Medicare Other | Admitting: Anesthesiology

## 2015-05-12 ENCOUNTER — Encounter (HOSPITAL_COMMUNITY): Payer: Self-pay | Admitting: *Deleted

## 2015-05-12 ENCOUNTER — Inpatient Hospital Stay (HOSPITAL_COMMUNITY)
Admission: RE | Admit: 2015-05-12 | Discharge: 2015-05-14 | DRG: 470 | Disposition: A | Discharge status: Home Health | Payer: Medicare Other | Source: Ambulatory Visit | Attending: Specialist | Admitting: Specialist

## 2015-05-12 ENCOUNTER — Encounter (HOSPITAL_COMMUNITY)
Admission: RE | Disposition: A | Discharge status: Home Health | Payer: Self-pay | Source: Ambulatory Visit | Attending: Specialist

## 2015-05-12 DIAGNOSIS — E785 Hyperlipidemia, unspecified: Secondary | ICD-10-CM | POA: Diagnosis present

## 2015-05-12 DIAGNOSIS — Z96659 Presence of unspecified artificial knee joint: Secondary | ICD-10-CM

## 2015-05-12 DIAGNOSIS — Z01812 Encounter for preprocedural laboratory examination: Secondary | ICD-10-CM | POA: Diagnosis not present

## 2015-05-12 DIAGNOSIS — I10 Essential (primary) hypertension: Secondary | ICD-10-CM | POA: Diagnosis present

## 2015-05-12 DIAGNOSIS — M1711 Unilateral primary osteoarthritis, right knee: Principal | ICD-10-CM | POA: Diagnosis present

## 2015-05-12 DIAGNOSIS — M25561 Pain in right knee: Secondary | ICD-10-CM | POA: Diagnosis present

## 2015-05-12 DIAGNOSIS — M179 Osteoarthritis of knee, unspecified: Secondary | ICD-10-CM | POA: Diagnosis not present

## 2015-05-12 HISTORY — PX: TOTAL KNEE ARTHROPLASTY: SHX125

## 2015-05-12 SURGERY — ARTHROPLASTY, KNEE, TOTAL
Anesthesia: Spinal | Site: Knee | Laterality: Right

## 2015-05-12 MED ORDER — PROPOFOL 10 MG/ML IV BOLUS
INTRAVENOUS | Status: DC | PRN
  Administered 2015-05-12: 30 mg via INTRAVENOUS

## 2015-05-12 MED ORDER — FERROUS SULFATE 325 (65 FE) MG PO TABS
325.0000 mg | ORAL_TABLET | Freq: Three times a day (TID) | ORAL | Status: DC
  Administered 2015-05-13 – 2015-05-14 (×4): 325 mg via ORAL
  Filled 2015-05-12 (×7): qty 1

## 2015-05-12 MED ORDER — PROPOFOL INFUSION 10 MG/ML OPTIME
INTRAVENOUS | Status: DC | PRN
  Administered 2015-05-12: 120 ug/kg/min via INTRAVENOUS

## 2015-05-12 MED ORDER — BUPIVACAINE-EPINEPHRINE (PF) 0.25% -1:200000 IJ SOLN
INTRAMUSCULAR | Status: AC
Start: 2015-05-12 — End: 2015-05-12
  Filled 2015-05-12: qty 30

## 2015-05-12 MED ORDER — HYDROMORPHONE HCL 1 MG/ML IJ SOLN: 1.0000 mg | INTRAMUSCULAR | Status: DC | PRN

## 2015-05-12 MED ORDER — ALUM & MAG HYDROXIDE-SIMETH 200-200-20 MG/5ML PO SUSP
30.0000 mL | ORAL | Status: DC | PRN
  Administered 2015-05-13: 30 mL via ORAL
  Filled 2015-05-12: qty 30

## 2015-05-12 MED ORDER — CLINDAMYCIN PHOSPHATE 900 MG/50ML IV SOLN
900.0000 mg | INTRAVENOUS | Status: AC
  Administered 2015-05-12: 900 mg via INTRAVENOUS

## 2015-05-12 MED ORDER — KETOROLAC TROMETHAMINE 30 MG/ML IJ SOLN
INTRAMUSCULAR | Status: AC
  Filled 2015-05-12: qty 1

## 2015-05-12 MED ORDER — ONDANSETRON HCL 4 MG PO TABS: 4.0000 mg | ORAL_TABLET | Freq: Four times a day (QID) | ORAL | Status: DC | PRN

## 2015-05-12 MED ORDER — PROPOFOL 10 MG/ML IV BOLUS
INTRAVENOUS | Status: AC
  Filled 2015-05-12: qty 20

## 2015-05-12 MED ORDER — METOCLOPRAMIDE HCL 5 MG/ML IJ SOLN: 5.0000 mg | Freq: Three times a day (TID) | INTRAMUSCULAR | Status: DC | PRN

## 2015-05-12 MED ORDER — ACETAMINOPHEN 650 MG RE SUPP: 650.0000 mg | Freq: Four times a day (QID) | RECTAL | Status: DC | PRN

## 2015-05-12 MED ORDER — METHOCARBAMOL 500 MG PO TABS
500.0000 mg | ORAL_TABLET | Freq: Four times a day (QID) | ORAL | Status: DC | PRN
  Administered 2015-05-12 – 2015-05-14 (×4): 500 mg via ORAL
  Filled 2015-05-12 (×4): qty 1

## 2015-05-12 MED ORDER — PHENYLEPHRINE HCL 10 MG/ML IJ SOLN
10.0000 mg | INTRAVENOUS | Status: DC | PRN
  Administered 2015-05-12: 30 ug/min via INTRAVENOUS

## 2015-05-12 MED ORDER — PHENYLEPHRINE HCL 10 MG/ML IJ SOLN
INTRAMUSCULAR | Status: DC | PRN
  Administered 2015-05-12: 120 ug via INTRAVENOUS

## 2015-05-12 MED ORDER — IRBESARTAN 300 MG PO TABS
300.0000 mg | ORAL_TABLET | Freq: Every day | ORAL | Status: DC
  Administered 2015-05-12 – 2015-05-14 (×2): 300 mg via ORAL
  Filled 2015-05-12 (×3): qty 1

## 2015-05-12 MED ORDER — BUPIVACAINE-EPINEPHRINE 0.25% -1:200000 IJ SOLN
INTRAMUSCULAR | Status: DC | PRN
  Administered 2015-05-12: 30 mL

## 2015-05-12 MED ORDER — HYDROCHLOROTHIAZIDE 12.5 MG PO CAPS
12.5000 mg | ORAL_CAPSULE | Freq: Every day | ORAL | Status: DC
  Administered 2015-05-12 – 2015-05-14 (×2): 12.5 mg via ORAL
  Filled 2015-05-12 (×3): qty 1

## 2015-05-12 MED ORDER — 0.9 % SODIUM CHLORIDE (POUR BTL) OPTIME
TOPICAL | Status: DC | PRN
  Administered 2015-05-12: 1000 mL

## 2015-05-12 MED ORDER — FENTANYL CITRATE (PF) 250 MCG/5ML IJ SOLN
INTRAMUSCULAR | Status: AC
  Filled 2015-05-12: qty 5

## 2015-05-12 MED ORDER — LACTATED RINGERS IV SOLN: INTRAVENOUS | Status: DC

## 2015-05-12 MED ORDER — ACETAMINOPHEN 10 MG/ML IV SOLN
1000.0000 mg | Freq: Once | INTRAVENOUS | Status: AC
  Administered 2015-05-12: 1000 mg via INTRAVENOUS

## 2015-05-12 MED ORDER — ENOXAPARIN SODIUM 30 MG/0.3ML ~~LOC~~ SOLN
30.0000 mg | Freq: Two times a day (BID) | SUBCUTANEOUS | Status: DC
  Administered 2015-05-13 – 2015-05-14 (×3): 30 mg via SUBCUTANEOUS
  Filled 2015-05-12 (×5): qty 0.3

## 2015-05-12 MED ORDER — ONDANSETRON HCL 4 MG/2ML IJ SOLN: 4.0000 mg | Freq: Four times a day (QID) | INTRAMUSCULAR | Status: DC | PRN

## 2015-05-12 MED ORDER — POLYETHYLENE GLYCOL 3350 17 G PO PACK
17.0000 g | PACK | Freq: Every day | ORAL | Status: DC | PRN
  Administered 2015-05-13: 17 g via ORAL
  Filled 2015-05-12: qty 1

## 2015-05-12 MED ORDER — DEXAMETHASONE SODIUM PHOSPHATE 10 MG/ML IJ SOLN
10.0000 mg | Freq: Once | INTRAMUSCULAR | Status: AC
  Administered 2015-05-13: 10 mg via INTRAVENOUS
  Filled 2015-05-12: qty 1

## 2015-05-12 MED ORDER — SODIUM CHLORIDE 0.9 % IJ SOLN
INTRAMUSCULAR | Status: DC | PRN
  Administered 2015-05-12: 30 mL

## 2015-05-12 MED ORDER — OXYCODONE HCL 5 MG PO TABS
5.0000 mg | ORAL_TABLET | ORAL | Status: DC | PRN
  Administered 2015-05-12 – 2015-05-13 (×4): 5 mg via ORAL
  Administered 2015-05-13: 10 mg via ORAL
  Administered 2015-05-13 (×2): 5 mg via ORAL
  Administered 2015-05-14 (×2): 10 mg via ORAL
  Administered 2015-05-14: 5 mg via ORAL
  Filled 2015-05-12 (×2): qty 1
  Filled 2015-05-12 (×2): qty 2
  Filled 2015-05-12 (×5): qty 1
  Filled 2015-05-12: qty 2

## 2015-05-12 MED ORDER — PHENOL 1.4 % MT LIQD: 1.0000 | OROMUCOSAL | Status: DC | PRN

## 2015-05-12 MED ORDER — DOCUSATE SODIUM 100 MG PO CAPS
100.0000 mg | ORAL_CAPSULE | Freq: Two times a day (BID) | ORAL | Status: DC
  Administered 2015-05-12 – 2015-05-14 (×4): 100 mg via ORAL

## 2015-05-12 MED ORDER — METHOCARBAMOL 1000 MG/10ML IJ SOLN
500.0000 mg | Freq: Four times a day (QID) | INTRAVENOUS | Status: DC | PRN
  Filled 2015-05-12: qty 5

## 2015-05-12 MED ORDER — FLEET ENEMA 7-19 GM/118ML RE ENEM: 1.0000 | ENEMA | Freq: Once | RECTAL | Status: AC | PRN

## 2015-05-12 MED ORDER — CLINDAMYCIN PHOSPHATE 900 MG/50ML IV SOLN
INTRAVENOUS | Status: AC
  Filled 2015-05-12: qty 50

## 2015-05-12 MED ORDER — FENTANYL CITRATE (PF) 100 MCG/2ML IJ SOLN
INTRAMUSCULAR | Status: AC
  Filled 2015-05-12: qty 2

## 2015-05-12 MED ORDER — ACETAMINOPHEN 10 MG/ML IV SOLN
INTRAVENOUS | Status: AC
  Filled 2015-05-12: qty 100

## 2015-05-12 MED ORDER — DEXAMETHASONE SODIUM PHOSPHATE 10 MG/ML IJ SOLN
10.0000 mg | Freq: Once | INTRAMUSCULAR | Status: AC
  Administered 2015-05-12: 10 mg via INTRAVENOUS

## 2015-05-12 MED ORDER — VALSARTAN-HYDROCHLOROTHIAZIDE 320-12.5 MG PO TABS: 1.0000 | ORAL_TABLET | Freq: Every day | ORAL | Status: DC

## 2015-05-12 MED ORDER — ACETAMINOPHEN 325 MG PO TABS
650.0000 mg | ORAL_TABLET | Freq: Four times a day (QID) | ORAL | Status: DC | PRN
  Administered 2015-05-12: 650 mg via ORAL

## 2015-05-12 MED ORDER — DIPHENHYDRAMINE HCL 12.5 MG/5ML PO ELIX: 12.5000 mg | ORAL_SOLUTION | ORAL | Status: DC | PRN

## 2015-05-12 MED ORDER — BISACODYL 10 MG RE SUPP: 10.0000 mg | Freq: Every day | RECTAL | Status: DC | PRN

## 2015-05-12 MED ORDER — MENTHOL 3 MG MT LOZG: 1.0000 | LOZENGE | OROMUCOSAL | Status: DC | PRN

## 2015-05-12 MED ORDER — CEFAZOLIN SODIUM-DEXTROSE 2-3 GM-% IV SOLR
2.0000 g | Freq: Four times a day (QID) | INTRAVENOUS | Status: AC
  Administered 2015-05-12 – 2015-05-13 (×2): 2 g via INTRAVENOUS
  Filled 2015-05-12 (×2): qty 50

## 2015-05-12 MED ORDER — PHENYLEPHRINE 40 MCG/ML (10ML) SYRINGE FOR IV PUSH (FOR BLOOD PRESSURE SUPPORT)
PREFILLED_SYRINGE | INTRAVENOUS | Status: AC
  Filled 2015-05-12: qty 10

## 2015-05-12 MED ORDER — LACTATED RINGERS IV SOLN
INTRAVENOUS | Status: DC
  Administered 2015-05-12 (×2): via INTRAVENOUS
  Administered 2015-05-12: 1000 mL via INTRAVENOUS

## 2015-05-12 MED ORDER — FENTANYL CITRATE (PF) 100 MCG/2ML IJ SOLN
INTRAMUSCULAR | Status: DC | PRN
  Administered 2015-05-12 (×2): 100 ug via INTRAVENOUS

## 2015-05-12 MED ORDER — MIDAZOLAM HCL 5 MG/5ML IJ SOLN
INTRAMUSCULAR | Status: DC | PRN
  Administered 2015-05-12: 0.5 mg via INTRAVENOUS
  Administered 2015-05-12: 1 mg via INTRAVENOUS

## 2015-05-12 MED ORDER — POVIDONE-IODINE 7.5 % EX SOLN: Freq: Once | CUTANEOUS | Status: DC

## 2015-05-12 MED ORDER — PHENYLEPHRINE HCL 10 MG/ML IJ SOLN
INTRAMUSCULAR | Status: AC
  Filled 2015-05-12: qty 1

## 2015-05-12 MED ORDER — SODIUM CHLORIDE 0.9 % IR SOLN
Status: DC | PRN
  Administered 2015-05-12: 1000 mL

## 2015-05-12 MED ORDER — ONDANSETRON HCL 4 MG/2ML IJ SOLN
INTRAMUSCULAR | Status: DC | PRN
  Administered 2015-05-12: 4 mg via INTRAVENOUS

## 2015-05-12 MED ORDER — METOCLOPRAMIDE HCL 10 MG PO TABS: 5.0000 mg | ORAL_TABLET | Freq: Three times a day (TID) | ORAL | Status: DC | PRN

## 2015-05-12 MED ORDER — POTASSIUM CHLORIDE IN NACL 20-0.9 MEQ/L-% IV SOLN
INTRAVENOUS | Status: DC
  Administered 2015-05-12 – 2015-05-13 (×2): via INTRAVENOUS
  Filled 2015-05-12 (×4): qty 1000

## 2015-05-12 MED ORDER — SODIUM CHLORIDE 0.9 % IJ SOLN
INTRAMUSCULAR | Status: AC
  Filled 2015-05-12: qty 50

## 2015-05-12 MED ORDER — FENTANYL CITRATE (PF) 100 MCG/2ML IJ SOLN
25.0000 ug | INTRAMUSCULAR | Status: DC | PRN
  Administered 2015-05-12: 50 ug via INTRAVENOUS

## 2015-05-12 MED ORDER — MIDAZOLAM HCL 2 MG/2ML IJ SOLN
INTRAMUSCULAR | Status: AC
  Filled 2015-05-12: qty 2

## 2015-05-12 MED ORDER — KETOROLAC TROMETHAMINE 30 MG/ML IJ SOLN
INTRAMUSCULAR | Status: DC | PRN
  Administered 2015-05-12: 30 mg

## 2015-05-12 SURGICAL SUPPLY — 63 items
BAG ZIPLOCK 12X15 (MISCELLANEOUS) ×6 IMPLANT
BANDAGE ELASTIC 4 VELCRO ST LF (GAUZE/BANDAGES/DRESSINGS) ×3 IMPLANT
BANDAGE ELASTIC 6 VELCRO ST LF (GAUZE/BANDAGES/DRESSINGS) ×3 IMPLANT
BANDAGE ESMARK 6X9 LF (GAUZE/BANDAGES/DRESSINGS) ×1 IMPLANT
BLADE SAG 18X100X1.27 (BLADE) ×3 IMPLANT
BLADE SAW SGTL 13.0X1.19X90.0M (BLADE) ×3 IMPLANT
BNDG ESMARK 6X9 LF (GAUZE/BANDAGES/DRESSINGS) ×3
CAP KNEE TOTAL 3 SIGMA ×3 IMPLANT
CEMENT HV SMART SET (Cement) ×6 IMPLANT
CUFF TOURN SGL QUICK 34 (TOURNIQUET CUFF) ×2
CUFF TRNQT CYL 34X4X40X1 (TOURNIQUET CUFF) ×1 IMPLANT
DRAPE EXTREMITY T 121X128X90 (DRAPE) ×3 IMPLANT
DRAPE POUCH INSTRU U-SHP 10X18 (DRAPES) ×3 IMPLANT
DRAPE SHEET LG 3/4 BI-LAMINATE (DRAPES) ×3 IMPLANT
DRAPE U-SHAPE 47X51 STRL (DRAPES) ×3 IMPLANT
DRSG AQUACEL AG ADV 3.5X10 (GAUZE/BANDAGES/DRESSINGS) ×3 IMPLANT
DRSG AQUACEL AG ADV 3.5X14 (GAUZE/BANDAGES/DRESSINGS) ×3 IMPLANT
DRSG TEGADERM 4X4.75 (GAUZE/BANDAGES/DRESSINGS) ×3 IMPLANT
DURAPREP 26ML APPLICATOR (WOUND CARE) ×3 IMPLANT
ELECT REM PT RETURN 9FT ADLT (ELECTROSURGICAL) ×3
ELECTRODE REM PT RTRN 9FT ADLT (ELECTROSURGICAL) ×1 IMPLANT
EVACUATOR 1/8 PVC DRAIN (DRAIN) ×3 IMPLANT
FACESHIELD WRAPAROUND (MASK) ×15 IMPLANT
GAUZE SPONGE 2X2 8PLY STRL LF (GAUZE/BANDAGES/DRESSINGS) ×1 IMPLANT
GLOVE BIOGEL PI IND STRL 8 (GLOVE) ×2 IMPLANT
GLOVE BIOGEL PI INDICATOR 8 (GLOVE) ×4
GLOVE SURG ORTHO 8.0 STRL STRW (GLOVE) ×3 IMPLANT
GLOVE SURG ORTHO 9.0 STRL STRW (GLOVE) ×3 IMPLANT
GLOVE SURG SS PI 7.5 STRL IVOR (GLOVE) ×3 IMPLANT
GOWN STRL REUS W/TWL XL LVL3 (GOWN DISPOSABLE) ×6 IMPLANT
HANDPIECE INTERPULSE COAX TIP (DISPOSABLE) ×2
IMMOBILIZER KNEE 20 (SOFTGOODS) ×3
IMMOBILIZER KNEE 20 THIGH 36 (SOFTGOODS) ×1 IMPLANT
KIT BASIN OR (CUSTOM PROCEDURE TRAY) ×3 IMPLANT
LIQUID BAND (GAUZE/BANDAGES/DRESSINGS) ×3 IMPLANT
NDL SAFETY ECLIPSE 18X1.5 (NEEDLE) ×1 IMPLANT
NEEDLE HYPO 18GX1.5 SHARP (NEEDLE) ×2
NS IRRIG 1000ML POUR BTL (IV SOLUTION) ×3 IMPLANT
PACK TOTAL JOINT (CUSTOM PROCEDURE TRAY) ×3 IMPLANT
POSITIONER SURGICAL ARM (MISCELLANEOUS) ×3 IMPLANT
SET HNDPC FAN SPRY TIP SCT (DISPOSABLE) ×1 IMPLANT
SET PAD KNEE POSITIONER (MISCELLANEOUS) ×3 IMPLANT
SPONGE GAUZE 2X2 STER 10/PKG (GAUZE/BANDAGES/DRESSINGS) ×2
SPONGE LAP 18X18 X RAY DECT (DISPOSABLE) IMPLANT
SPONGE SURGIFOAM ABS GEL 100 (HEMOSTASIS) IMPLANT
STOCKINETTE 6  STRL (DRAPES) ×2
STOCKINETTE 6 STRL (DRAPES) ×1 IMPLANT
SUCTION FRAZIER 12FR DISP (SUCTIONS) ×3 IMPLANT
SUT BONE WAX W31G (SUTURE) IMPLANT
SUT MNCRL AB 3-0 PS2 18 (SUTURE) ×3 IMPLANT
SUT VIC AB 1 CT1 27 (SUTURE) ×8
SUT VIC AB 1 CT1 27XBRD ANTBC (SUTURE) ×4 IMPLANT
SUT VIC AB 2-0 CT1 27 (SUTURE) ×4
SUT VIC AB 2-0 CT1 TAPERPNT 27 (SUTURE) ×2 IMPLANT
SUT VLOC 180 0 24IN GS25 (SUTURE) ×3 IMPLANT
SYR 50ML LL SCALE MARK (SYRINGE) ×3 IMPLANT
TAPE STRIPS DRAPE STRL (GAUZE/BANDAGES/DRESSINGS) ×3 IMPLANT
TOWEL OR 17X26 10 PK STRL BLUE (TOWEL DISPOSABLE) ×3 IMPLANT
TOWEL OR NON WOVEN STRL DISP B (DISPOSABLE) ×3 IMPLANT
TOWER CARTRIDGE SMART MIX (DISPOSABLE) ×3 IMPLANT
TRAY FOLEY W/METER SILVER 16FR (SET/KITS/TRAYS/PACK) ×3 IMPLANT
WATER STERILE IRR 1500ML POUR (IV SOLUTION) ×3 IMPLANT
WRAP KNEE MAXI GEL POST OP (GAUZE/BANDAGES/DRESSINGS) ×3 IMPLANT

## 2015-05-12 NOTE — Anesthesia Preprocedure Evaluation (Signed)
Anesthesia Evaluation  Patient identified by MRN, date of birth, ID band Patient awake    Reviewed: Allergy & Precautions, H&P , NPO status , Patient's Chart, lab work & pertinent test results  Airway Mallampati: II  TM Distance: >3 FB Neck ROM: full    Dental no notable dental hx.    Pulmonary neg pulmonary ROS,  breath sounds clear to auscultation  Pulmonary exam normal       Cardiovascular Exercise Tolerance: Good hypertension, Pt. on medications Normal cardiovascular examRhythm:regular Rate:Normal  AAA repair   Neuro/Psych negative neurological ROS  negative psych ROS   GI/Hepatic negative GI ROS, Neg liver ROS,   Endo/Other  negative endocrine ROS  Renal/GU negative Renal ROS  negative genitourinary   Musculoskeletal   Abdominal   Peds  Hematology negative hematology ROS (+)   Anesthesia Other Findings   Reproductive/Obstetrics negative OB ROS                             Anesthesia Physical Anesthesia Plan  ASA: III  Anesthesia Plan: Spinal   Post-op Pain Management:    Induction:   Airway Management Planned:   Additional Equipment:   Intra-op Plan:   Post-operative Plan:   Informed Consent: I have reviewed the patients History and Physical, chart, labs and discussed the procedure including the risks, benefits and alternatives for the proposed anesthesia with the patient or authorized representative who has indicated his/her understanding and acceptance.   Dental Advisory Given  Plan Discussed with: CRNA and Surgeon  Anesthesia Plan Comments:         Anesthesia Quick Evaluation

## 2015-05-12 NOTE — Anesthesia Postprocedure Evaluation (Signed)
  Anesthesia Post-op Note  Patient: Russell Wagner  Procedure(s) Performed: Procedure(s) (LRB): RIGHT TOTAL KNEE ARTHROPLASTY (Right)  Patient Location: PACU  Anesthesia Type: Spinal  Level of Consciousness: awake and alert   Airway and Oxygen Therapy: Patient Spontanous Breathing  Post-op Pain: mild  Post-op Assessment: Post-op Vital signs reviewed, Patient's Cardiovascular Status Stable, Respiratory Function Stable, Patent Airway and No signs of Nausea or vomiting  Last Vitals:  Filed Vitals:   05/12/15 1650  BP: 150/98  Pulse: 82  Temp: 36.5 C  Resp: 18    Post-op Vital Signs: stable   Complications: No apparent anesthesia complications

## 2015-05-12 NOTE — Interval H&P Note (Signed)
History and Physical Interval Note:  05/12/2015 12:55 PM  Russell Wagner  has presented today for surgery, with the diagnosis of RIGHT KNEE OSTEOARTHRITIS  The various methods of treatment have been discussed with the patient and family. After consideration of risks, benefits and other options for treatment, the patient has consented to  Procedure(s): RIGHT TOTAL KNEE ARTHROPLASTY (Right) as a surgical intervention .  The patient's history has been reviewed, patient examined, no change in status, stable for surgery.  I have reviewed the patient's chart and labs.  Questions were answered to the patient's satisfaction.     Ettore Trebilcock ANDREW

## 2015-05-12 NOTE — Plan of Care (Signed)
Problem: Consults Goal: Diagnosis- Total Joint Replacement Right total knee     

## 2015-05-12 NOTE — Op Note (Signed)
DATE OF SURGERY:  05/12/2015  TIME: 3:34 PM  PATIENT NAME:  Russell Wagner    AGE: 74 y.o.   PRE-OPERATIVE DIAGNOSIS:  RIGHT KNEE OSTEOARTHRITIS  POST-OPERATIVE DIAGNOSIS:  RIGHT KNEE OSTEOARTHRITIS  PROCEDURE:  Procedure(s): RIGHT TOTAL KNEE ARTHROPLASTY  SURGEON:  Taima Rada ANDREW  ASSISTANT:  Bryson Stilwell, PA-C, present and scrubbed throughout the case, critical for assistance with exposure, retraction, instrumentation, and closure.  OPERATIVE IMPLANTS: Depuy PFC Sigma Rotating Platform.  Femur size 5, Tibia size 5, Patella size 42 3-peg oval button, with a 10 mm polyethylene insert.   PREOPERATIVE INDICATIONS:   Russell Wagner is a 74 y.o. year old male with end stage bone on bone arthritis of the knee who failed conservative treatment and elected for Total Knee Arthroplasty.   The risks, benefits, and alternatives were discussed at length including but not limited to the risks of infection, bleeding, nerve injury, stiffness, blood clots, the need for revision surgery, cardiopulmonary complications, among others, and they were willing to proceed.  OPERATIVE DESCRIPTION:  The patient was brought to the operative room and placed in a supine position.  Spinal anesthesia was administered.  IV antibiotics were given.  The lower extremity was prepped and draped in the usual sterile fashion.  Time out was performed.  The leg was elevated and exsanguinated and the tourniquet was inflated.  Anterior quadriceps tendon splitting approach was performed.  The patella was retracted and osteophytes were removed.  The anterior horn of the medial and lateral meniscus was removed and cruciate ligaments resected.   The distal femur was opened with the drill and the intramedullary distal femoral cutting jig was utilized, set at 5 degrees resecting 10 mm off the distal femur.  Care was taken to protect the collateral ligaments.  The distal femoral sizing jig was applied, taking care to  avoid notching.  Then the 4-in-1 cutting jig was applied and the anterior and posterior femur was cut, along with the chamfer cuts.    Then the extramedullary tibial cutting jig was utilized making the appropriate cut using the anterior tibial crest as a reference building in appropriate posterior slope.  Care was taken during the cut to protect the medial and collateral ligaments.  The proximal tibia was removed along with the posterior horns of the menisci.   The posterior medial femoral osteophytes and posterior lateral femoral osteophytes were removed.    The flexion gap was then measured and was symmetric with the extension gap, measured at 10.  I completed the distal femoral preparation using the appropriate jig to prepare the box.  The patella was then measured, and cut with the saw.    The proximal tibia sized and prepared accordingly with the reamer and the punch, and then all components were trialed with the trial insert.  The knee was found to have excellent balance and full motion.    The above named components were then cemented into place and all excess cement was removed.  The trial polyethylene component was in place during cementation, and then was exchanged for the real polyethylene component.    The knee was easily taken through a range of motion and the patella tracked well and the knee irrigated copiously and the parapatellar and subcutaneous tissue closed with vicryl, and monocryl with steri strips for the skin.  The arthrotomy was closed at 90 of flexion. The wounds were dressed with sterile gauze and the tourniquet released and the patient was awakened and returned to the PACU  in stable and satisfactory condition.  There were no complications.  Total tourniquet time was 110 minutes.Vloc closure. 60cc saline,toradol,marcaine periosteal injection.

## 2015-05-12 NOTE — Transfer of Care (Signed)
Immediate Anesthesia Transfer of Care Note  Patient: Russell Wagner  Procedure(s) Performed: Procedure(s): RIGHT TOTAL KNEE ARTHROPLASTY (Right)  Patient Location: PACU  Anesthesia Type:Spinal  Level of Consciousness: awake, confused and responds to stimulation  Airway & Oxygen Therapy: Patient Spontanous Breathing and Patient connected to face mask oxygen  Post-op Assessment: Report given to RN and Post -op Vital signs reviewed and stable  Post vital signs: stable  Last Vitals:  Filed Vitals:   05/12/15 1050  BP: 147/85  Pulse: 87  Temp: 36.8 C  Resp: 18    Complications: No apparent anesthesia complications

## 2015-05-12 NOTE — Anesthesia Procedure Notes (Signed)
Spinal  End time: 05/12/2015 1:07 PM Staffing Resident/CRNA: Enrigue Catena E Preanesthetic Checklist Completed: patient identified, site marked, surgical consent, pre-op evaluation, timeout performed, IV checked, risks and benefits discussed and monitors and equipment checked Spinal Block Patient position: sitting Prep: Betadine Patient monitoring: heart rate, continuous pulse ox and blood pressure Approach: right paramedian Location: L2-3 Injection technique: single-shot Needle Needle type: Spinocan  Needle gauge: 22 G Assessment Sensory level: T4 Additional Notes Pt  Tolerated procedure well. Drugs and spinal kit within date. CSF x3, negative heme or paresthesia.

## 2015-05-13 LAB — BASIC METABOLIC PANEL
ANION GAP: 7 (ref 5–15)
BUN: 25 mg/dL — AB (ref 6–20)
CHLORIDE: 103 mmol/L (ref 101–111)
CO2: 27 mmol/L (ref 22–32)
Calcium: 8.2 mg/dL — ABNORMAL LOW (ref 8.9–10.3)
Creatinine, Ser: 1.09 mg/dL (ref 0.61–1.24)
GFR calc non Af Amer: >60 mL/min (ref >60)
GLUCOSE: 137 mg/dL — AB (ref 65–99)
Potassium: 4.5 mmol/L (ref 3.5–5.1)
Sodium: 137 mmol/L (ref 135–145)

## 2015-05-13 LAB — CBC
HEMATOCRIT: 32.1 % — AB (ref 39.0–52.0)
HEMOGLOBIN: 10.2 g/dL — AB (ref 13.0–17.0)
MCH: 29.9 pg (ref 26.0–34.0)
MCHC: 31.8 g/dL (ref 30.0–36.0)
MCV: 94.1 fL (ref 78.0–100.0)
PLATELETS: 218 10*3/uL (ref 150–400)
RBC: 3.41 MIL/uL — AB (ref 4.22–5.81)
RDW: 13.5 % (ref 11.5–15.5)
WBC: 16 10*3/uL — ABNORMAL HIGH (ref 4.0–10.5)

## 2015-05-13 NOTE — Progress Notes (Signed)
   Subjective: 1 Day Post-Op Procedure(s) (LRB): RIGHT TOTAL KNEE ARTHROPLASTY (Right)  Pt doing well this morning  Mild soreness and moving right knee very well Denies any new symptoms or issues Patient reports pain as mild.  Objective:   VITALS:   Filed Vitals:   05/13/15 0523  BP: 114/66  Pulse: 92  Temp: 98.3 F (36.8 C)  Resp: 16    Right knee drain pulled Dressing and immobilizer in place Good early rom nv intact distally  LABS  Recent Labs  05/10/15 1000 05/13/15 0418  HGB 13.6 10.2*  HCT 42.2 32.1*  WBC 8.2 16.0*  PLT 215 218     Recent Labs  05/10/15 1000 05/13/15 0418  NA 140 137  K 3.8 4.5  BUN 24* 25*  CREATININE 1.07 1.09  GLUCOSE 120* 137*     Assessment/Plan: 1 Day Post-Op Procedure(s) (LRB): RIGHT TOTAL KNEE ARTHROPLASTY (Right) PT/OT D/c planning Pt doing well Pain control as needed    Alphonsa OverallBrad Dixon, MPAS, PA-C  05/13/2015, 7:09 AM

## 2015-05-13 NOTE — Evaluation (Signed)
Physical Therapy Evaluation Patient Details Name: Russell Wagner MRN: [id number] DOB: [birth year] Today's Date: 05/13/2015   History of Present Illness  s/p R TKA  Clinical Impression  Pt admitted with above diagnosis. Pt currently with functional limitations due to the deficits listed below (see PT Problem List).  Pt will benefit from skilled PT to increase their independence and safety with mobility to allow discharge to the venue listed below.  Planning for HHPT; pt has RW at home     Follow Up Recommendations Home health PT    Equipment Recommendations  None recommended by PT    Recommendations for Other Services       Precautions / Restrictions Precautions Precautions: Fall;Knee Required Braces or Orthoses: Knee Immobilizer - Right Knee Immobilizer - Right: Discontinue once straight leg raise with < 10 degree lag Restrictions Other Position/Activity Restrictions: WBAT      Mobility  Bed Mobility Overal bed mobility: Needs Assistance Bed Mobility: Supine to Sit     Supine to sit: Min assist     General bed mobility comments: cues for technique, facilitation RLE movement and support to lower RLE to floor  Transfers Overall transfer level: Needs assistance Equipment used: Rolling walker (2 wheeled) Transfers: Sit to/from Stand Sit to Stand: Min guard         General transfer comment: cues for safety and hand placement  Ambulation/Gait Ambulation/Gait assistance: Min guard Ambulation Distance (Feet): 75 Feet Assistive device: Rolling walker (2 wheeled) Gait Pattern/deviations: Step-to pattern;Trunk flexed     General Gait Details: cues for sequence, posture  Stairs            Wheelchair Mobility    Modified Rankin (Stroke Patients Only)       Balance                                             Pertinent Vitals/Pain Pain Score: 4  Pain Location: right knee Pain Descriptors / Indicators: Sore Pain Intervention(s):  Limited activity within patient's tolerance;Monitored during session;Premedicated before session;Repositioned;Ice applied    Home Living Family/patient expects to be discharged to:: Private residence Living Arrangements: Spouse/significant other Available Help at Discharge: Family Type of Home: House Home Access: Stairs to enter   Secretary/administrator of Steps: 2 Home Layout: One level Home Equipment: Environmental consultant - 2 wheels;Bedside commode;Crutches;Walker - 4 wheels      Prior Function Level of Independence: Independent               Hand Dominance        Extremity/Trunk Assessment   Upper Extremity Assessment: Defer to OT evaluation           Lower Extremity Assessment: RLE deficits/detail RLE Deficits / Details: ankle grossly WFL; knee extension and hip flexion 3/5; AAROM ~8 to 40* knee flexion       Communication   Communication: HOH;No difficulties  Cognition Arousal/Alertness: Awake/alert Behavior During Therapy: WFL for tasks assessed/performed Overall Cognitive Status: Within Functional Limits for tasks assessed                      General Comments      Exercises Total Joint Exercises Ankle Circles/Pumps: AROM;Both;10 reps Quad Sets: AROM;Both;10 reps      Assessment/Plan    PT Assessment Patient needs continued PT services  PT Diagnosis Difficulty walking   PT Problem List  Decreased strength;Decreased range of motion;Decreased activity tolerance;Decreased mobility;Decreased knowledge of use of DME  PT Treatment Interventions DME instruction;Gait training;Functional mobility training;Therapeutic activities;Patient/family education;Therapeutic exercise;Stair training   PT Goals (Current goals can be found in the Care Plan section) Acute Rehab PT Goals Patient Stated Goal: return to active lifestyle  PT Goal Formulation: With patient Time For Goal Achievement: 05/20/15 Potential to Achieve Goals: Good    Frequency 7X/week   Barriers  to discharge        Co-evaluation               End of Session Equipment Utilized During Treatment: Gait belt Activity Tolerance: Patient tolerated treatment well Patient left: in chair;with call bell/phone within reach Nurse Communication: Mobility status         Time: [id number] PT Time Calculation (min) (ACUTE ONLY): 19 min   Charges:   PT Evaluation $Initial PT Evaluation Tier I: 1 Procedure     PT G CodesDrucilla Wagner:        Russell Wagner 05/13/2015, 12:05 PM

## 2015-05-13 NOTE — Progress Notes (Signed)
   05/13/15 1500  PT Visit Information  Last PT Received On 05/13/15  Assistance Needed +1  History of Present Illness s/p R TKA  PT Time Calculation  PT Start Time (ACUTE ONLY) 1521  PT Stop Time (ACUTE ONLY) 1545  PT Time Calculation (min) (ACUTE ONLY) 24 min  Subjective Data  Patient Stated Goal return to active lifestyle   Precautions  Precautions Fall;Knee  Precaution Comments I SLR  Required Braces or Orthoses Knee Immobilizer - Right  Knee Immobilizer - Right Discontinue once straight leg raise with < 10 degree lag  Restrictions  Other Position/Activity Restrictions WBAT  Pain Assessment  Pain Assessment 0-10  Pain Score 4  Pain Location R knee  Pain Descriptors / Indicators Sore  Pain Intervention(s) Limited activity within patient's tolerance;Monitored during session;Premedicated before session;Repositioned;Ice applied  Cognition  Arousal/Alertness Awake/alert  Behavior During Therapy WFL for tasks assessed/performed  Overall Cognitive Status Within Functional Limits for tasks assessed  Bed Mobility  Overal bed mobility Needs Assistance  Bed Mobility Sit to Supine  Sit to supine Min assist  General bed mobility comments assist with RLE onto bed  Transfers  Overall transfer level Needs assistance  Equipment used Rolling walker (2 wheeled)  Transfers Sit to/from Stand  Sit to Stand Min guard  General transfer comment cues for safety and hand placement  Ambulation/Gait  Ambulation/Gait assistance Min guard  Ambulation Distance (Feet) 60 Feet  Assistive device Rolling walker (2 wheeled)  General Gait Details cues for sequence, posture  Gait Pattern/deviations Step-to pattern  Total Joint Exercises  Ankle Circles/Pumps AROM;Both;10 reps  Quad Sets AROM;Both;10 reps  Heel Slides AAROM;Right;10 reps  Hip ABduction/ADduction AAROM;AROM;Right;10 reps  Straight Leg Raises AROM;AAROM;Right;10 reps  PT - End of Session  Equipment Utilized During Treatment Gait belt   Activity Tolerance Patient tolerated treatment well  Patient left in bed;with call bell/phone within reach;with family/visitor present  Nurse Communication Mobility status  PT - Assessment/Plan  PT Plan Current plan remains appropriate  PT Frequency (ACUTE ONLY) 7X/week  Follow Up Recommendations Home health PT;Supervision for mobility/OOB  PT equipment None recommended by PT  PT Goal Progression  Progress towards PT goals Progressing toward goals  Acute Rehab PT Goals  PT Goal Formulation With patient  Time For Goal Achievement 05/20/15  Potential to Achieve Goals Good  PT General Charges  $$ ACUTE PT VISIT 1 Procedure  PT Treatments  $Gait Training 8-22 mins  $Therapeutic Exercise 8-22 mins

## 2015-05-13 NOTE — Care Management Note (Signed)
Case Management Note  Patient Details  Name: Lavonda JumboJames R Cahoon MRN: [id number] Date of Birth: 05/22/1941  Subjective/Objective:[facility] rep Lupita LeashDonna already following for d/c needs if home.HHPT ordered.Patient has rw.                    Action[facility]Plan[facility]   Expected Discharge Date:                  Expected Discharge Plan:     In-House Referral:     Discharge planning Services     Post Acute Care Choice:    Choice offered to:     DME Arranged:    DME Agency:     HH Arranged:    HH Agency:     Status of Service:     Medicare Important Message Given:    Date Medicare IM Given:    Medicare IM give by:    Date Additional Medicare IM Given:    Additional Medicare Important Message give by:     If discussed at Long Length of Stay Meetings, dates discussed:    Additional Comments:  Lanier ClamMahabir, Jasiel Apachito, RN 05/13/2015, 2:23 PM

## 2015-05-14 LAB — CBC
HCT: 27 % — ABNORMAL LOW (ref 39.0–52.0)
HEMOGLOBIN: 8.7 g/dL — AB (ref 13.0–17.0)
MCH: 30.2 pg (ref 26.0–34.0)
MCHC: 32.2 g/dL (ref 30.0–36.0)
MCV: 93.8 fL (ref 78.0–100.0)
Platelets: 167 10*3/uL (ref 150–400)
RBC: 2.88 MIL/uL — ABNORMAL LOW (ref 4.22–5.81)
RDW: 13.6 % (ref 11.5–15.5)
WBC: 13.3 10*3/uL — ABNORMAL HIGH (ref 4.0–10.5)

## 2015-05-14 MED ORDER — OXYCODONE-ACETAMINOPHEN 5-325 MG PO TABS: 1.0000 | ORAL_TABLET | ORAL | Status: AC | PRN

## 2015-05-14 MED ORDER — ASPIRIN EC 325 MG PO TBEC: 325.0000 mg | DELAYED_RELEASE_TABLET | Freq: Two times a day (BID) | ORAL | Status: AC

## 2015-05-14 MED ORDER — FERROUS SULFATE 325 (65 FE) MG PO TABS: 325.0000 mg | ORAL_TABLET | Freq: Three times a day (TID) | ORAL | Status: AC

## 2015-05-14 MED ORDER — BACLOFEN 10 MG PO TABS: 10.0000 mg | ORAL_TABLET | Freq: Three times a day (TID) | ORAL | Status: AC | PRN

## 2015-05-14 NOTE — Progress Notes (Signed)
Subjective: 2 Days Post-Op Procedure(s) (LRB): RIGHT TOTAL KNEE ARTHROPLASTY (Right) Patient reports pain as mild to right knee.  Well controlled on medications. Tolerating PO's well. Positive Flatus. No N/V. Progressing with PT. Denies CP, SOB, calf pain, or Lightheadedness. Reports he is ready to D/c.  Objective: Vital signs in last 24 hours: Temp:  [98.5 F (36.9 C)-99.4 F (37.4 C)] 99 F (37.2 C) (06/05 0447) Pulse Rate:  [64-107] 107 (06/05 0447) Resp:  [16] 16 (06/05 0447) BP: (103-130)/(53-76) 124/72 mmHg (06/05 0447) SpO2:  [93 %-94 %] 93 % (06/05 0447)  Intake/Output from previous day: 06/04 0701 - 06/05 0700 In: 1743.7 [P.O.:1440; I.V.:303.7] Out: 1105 [Urine:1105] Intake/Output this shift:     Recent Labs  05/13/15 0418 05/14/15 0545  HGB 10.2* 8.7*    Recent Labs  05/13/15 0418 05/14/15 0545  WBC 16.0* 13.3*  RBC 3.41* 2.88*  HCT 32.1* 27.0*  PLT 218 167    Recent Labs  05/13/15 0418  NA 137  K 4.5  CL 103  CO2 27  BUN 25*  CREATININE 1.09  GLUCOSE 137*  CALCIUM 8.2*   No results for input(s): LABPT, INR in the last 72 hours.  Well nourished. Alert and oriented x3. RRR, Lungs clear, BS x4. Abdomen soft and non tender. Right Calf soft and non tender. Right knee dressing C/D/I. No DVT signs. Compartment soft. No signs of infection.  Right LE grossly neurovascular intact.  Assessment/Plan: 2 Days Post-Op Procedure(s) (LRB): RIGHT TOTAL KNEE ARTHROPLASTY (Right) Up with PT D/c home today with Family F/u in in the office Follow instructions  Thedora Rings L 05/14/2015, 8:09 AM

## 2015-05-14 NOTE — Progress Notes (Signed)
Physical Therapy Treatment Patient Details Name: Russell Wagner MRN: [id number] DOB: 1941-01-28 Today's Date: 05/14/2015    History of Present Illness s/p R TKA    PT Comments    Pt  Became clammy, pale while doing steps, pt repeatedly stated he was "fine"; BP 123/61,  sats 94% on RA, HR 113; Rn adn NT informed; later spoke with and  updated pt wife as well;  Follow Up Recommendations  Home health PT;Supervision for mobility/OOB     Equipment Recommendations  None recommended by PT    Recommendations for Other Services       Precautions / Restrictions Precautions Precautions: Fall;Knee Precaution Comments: pt unable to do SLR today, KI placed Required Braces or Orthoses: Knee Immobilizer - Right Knee Immobilizer - Right: Discontinue once straight leg raise with < 10 degree lag Restrictions Other Position/Activity Restrictions: WBAT    Mobility  Bed Mobility Overal bed mobility: Needs Assistance Bed Mobility: Supine to Sit     Supine to sit: Min assist Sit to supine: Min assist   General bed mobility comments: assist with RLE off bed  Transfers Overall transfer level: Needs assistance Equipment used: Rolling walker (2 wheeled) Transfers: Sit to/from Stand Sit to Stand: Min guard         General transfer comment: cues for safety and hand placement  Ambulation/Gait Ambulation/Gait assistance: Min guard;Min assist Ambulation Distance (Feet): 50 Feet Assistive device: Rolling walker (2 wheeled) Gait Pattern/deviations: Decreased step length - left;Antalgic;Trunk flexed     General Gait Details: cues for sequence, posture, incr step length on LLE   Stairs Stairs: Yes Stairs assistance: Min assist Stair Management: Forwards;One rail Right;Step to pattern Number of Stairs: 3 General stair comments: cues for sequence and technique;  Wheelchair Mobility    Modified Rankin (Stroke Patients Only)       Balance                                     Cognition Arousal/Alertness: Awake/alert Behavior During Therapy: WFL for tasks assessed/performed Overall Cognitive Status: Within Functional Limits for tasks assessed                      Exercises Total Joint Exercises Ankle Circles/Pumps: AROM;Both;10 reps Quad Sets: AROM;Both;10 reps Heel Slides: AAROM;Right;10 reps Hip ABduction/ADduction: AAROM;AROM;Right;10 reps Straight Leg Raises: AROM;AAROM;Right;10 reps    General Comments        Pertinent Vitals/Pain Pain Assessment: 0-10 Pain Score: 4  Pain Location: R knee Pain Descriptors / Indicators: Sore Pain Intervention(s): Limited activity within patient's tolerance;Monitored during session;Premedicated before session;Ice applied    Home Living Family/patient expects to be discharged to:: Private residence Living Arrangements: Spouse/significant other Available Help at Discharge: Family Type of Home: House Home Access: Stairs to enter   Home Layout: One level Home Equipment: Environmental consultant - 2 wheels;Bedside commode;Crutches;Walker - 4 wheels;Shower seat - built in      Prior Function Level of Independence: Independent          PT Goals (current goals can now be found in the care plan section) Acute Rehab PT Goals Patient Stated Goal: to go home today PT Goal Formulation: With patient Time For Goal Achievement: 05/20/15 Potential to Achieve Goals: Good Progress towards PT goals: Progressing toward goals    Frequency  7X/week    PT Plan Current plan remains appropriate    Co-evaluation  End of Session Equipment Utilized During Treatment: Gait belt;Right knee immobilizer Activity Tolerance: Patient tolerated treatment well Patient left: in chair;with call bell/phone within reach     Time: 0938-1001 PT Time Calculation (min) (ACUTE ONLY): 23 min  Charges:  $Gait Training: 8-22 mins $Therapeutic Exercise: 8-22 mins                    G Codes:       Russell Wagner 05/14/2015, 12:48 PM

## 2015-05-14 NOTE — Care Management Note (Signed)
Case Management Note  Patient Details  Name: Russell Wagner MRN: [id number] Date of Birth: May 10, 1941  Subjective/Objective:                    Action/Plan:   Expected Discharge Date:       05/14/2015           Expected Discharge Plan:  Home w Home Health Services  In-House Referral:     Discharge planning Services  CM Consult  Post Acute Care Choice:  Home Health Choice offered to:  Patient  DME Arranged:  Walker rolling DME Agency:  [facility]  [facility]  Status of Service:  Completed, signed off  Medicare Important Message Given:  N/A - LOS <3 / Initial given by admissions Date Medicare IM Given:    Medicare IM give by:    Date Additional Medicare IM Given:    Additional Medicare Important Message give by:     If discussed at Long Length of Stay Meetings, dates discussed:    Additional Comments: Contacted [facility] for RW for home. Elliot CousinShavis, Hanzel Pizzo Ellen, RN 05/14/2015, 11:11 AM

## 2015-05-14 NOTE — Discharge Summary (Signed)
Physician Discharge Summary  Patient ID: Russell Wagner MRN: [id number] DOB/AGE: 1940-12-16 74 y.o.  Admit date: 05/12/2015 Discharge date: 05/14/2015  Admission Diagnoses: right knee primary osteoarthritis  Discharge Diagnoses:  Active Problems:   Primary osteoarthritis of right knee   S/P total knee arthroplasty   S/P knee replacement   Discharged Condition: good  Hospital Course:  Russell Wagner is a 74 y.o. who was admitted to [facility]. They were brought to the operating room on 05/12/2015 and underwent Procedure(s): RIGHT TOTAL KNEE ARTHROPLASTY.  Patient tolerated the procedure well and was later transferred to the recovery room and then to the orthopaedic floor for postoperative care.  They were given PO and IV analgesics for pain control following their surgery.  They were given 24 hours of postoperative antibiotics of  Anti-infectives    Start     Dose/Rate Route Frequency Ordered Stop   05/12/15 2000  ceFAZolin (ANCEF) IVPB 2 g/50 mL premix     2 g 100 mL/hr over 30 Minutes Intravenous Every 6 hours 05/12/15 1658 05/13/15 0151   05/12/15 1059  clindamycin (CLEOCIN) IVPB 900 mg     900 mg 100 mL/hr over 30 Minutes Intravenous On call to O.R. 05/12/15 1059 05/12/15 1300     and started on DVT prophylaxis in the form of lovenox.   PT and OT were ordered for total joint protocol.  Discharge planning consulted to help with postop disposition and equipment needs.  Patient had a good night on the evening of surgery and started to get up OOB with therapy on day one.  Hemovac drain was pulled without difficulty.  Continued to work with therapy into day two.  The patient had progressed with therapy and meeting their goals.  Incision was healing well.  Patient was seen in rounds and was ready to go home.  Consults: n/a  Significant Diagnostic Studies:routine  Treatments: routine  Discharge Exam: Blood pressure 124/72, pulse 107, temperature 99 F (37.2 C), temperature  source Oral, resp. rate 16, height  (1.854 m), weight 101.606 kg (224 lb), SpO2 93 %. Well nourished. Alert and oriented x3. RRR, Lungs clear, BS x4. Abdomen soft and non tender. Right Calf soft and non tender. Right knee dressing C/D/I. No DVT signs. Compartment soft. No signs of infection.  Right LE grossly neurovascular intact.  Disposition:   Discharge Instructions    Call MD / Call 911    Complete by:  As directed   If you experience chest pain or shortness of breath, CALL 911 and be transported to the hospital emergency room.  If you develope a fever above 101 F, pus (white drainage) or increased drainage or redness at the wound, or calf pain, call your surgeon's office.     Constipation Prevention    Complete by:  As directed   Drink plenty of fluids.  Prune juice may be helpful.  You may use a stool softener, such as Colace (over the counter) 100 mg twice a day.  Use MiraLax (over the counter) for constipation as needed.     Diet - low sodium heart healthy    Complete by:  As directed      Discharge instructions    Complete by:  As directed   INSTRUCTIONS AFTER JOINT REPLACEMENT   Remove items at home which could result in a fall. This includes throw rugs or furniture in walking pathways ICE to the affected joint every three hours while awake for 30 minutes at  a time, for at least the first 3-5 days, and then as needed for pain and swelling.  Continue to use ice for pain and swelling. You may notice swelling that will progress down to the foot and ankle.  This is normal after surgery.  Elevate your leg when you are not up walking on it.   Continue to use the breathing machine you got in the hospital (incentive spirometer) which will help keep your temperature down.  It is common for your temperature to cycle up and down following surgery, especially at night when you are not up moving around and exerting yourself.  The breathing machine keeps your lungs expanded and your temperature  down.   DIET:  As you were doing prior to hospitalization, we recommend a well-balanced diet.  DRESSING / WOUND CARE / SHOWERING   Leave dressing in place, may remove the drain site dressing on the side. Place a band aid and neosporin over the site. May shower.  ACTIVITY  Increase activity slowly as tolerated, but follow the weight bearing instructions below.   No driving for 6 weeks or until further direction given by your physician.  You cannot drive while taking narcotics.  No lifting or carrying greater than 10 lbs. until further directed by your surgeon. Avoid periods of inactivity such as sitting longer than an hour when not asleep. This helps prevent blood clots.  You may return to work once you are authorized by your doctor.     WEIGHT BEARING   As tolerated, unless directed otherwise. Use assistive devices for safety.     EXERCISES  Results after joint replacement surgery are often greatly improved when you follow the exercise, range of motion and muscle strengthening exercises prescribed by your doctor. Safety measures are also important to protect the joint from further injury. Any time any of these exercises cause you to have increased pain or swelling, decrease what you are doing until you are comfortable again and then slowly increase them. If you have problems or questions, call your caregiver or physical therapist for advice.   Rehabilitation is important following a joint replacement. After just a few days of immobilization, the muscles of the leg can become weakened and shrink (atrophy).  These exercises are designed to build up the tone and strength of the thigh and leg muscles and to improve motion. Often times heat used for twenty to thirty minutes before working out will loosen up your tissues and help with improving the range of motion but do not use heat for the first two weeks following surgery (sometimes heat can increase post-operative swelling).   These  exercises can be done on a training (exercise) mat, on the floor, on a table or on a bed. Use whatever works the best and is most comfortable for you.    Use music or television while you are exercising so that the exercises are a pleasant break in your day. This will make your life better with the exercises acting as a break in your routine that you can look forward to.   Perform all exercises about fifteen times, three times per day or as directed.  You should exercise both the operative leg and the other leg as well.    Exercises include:   Quad Sets - Tighten up the muscle on the front of the thigh (Quad) and hold for 5-10 seconds.   Straight Leg Raises - With your knee straight (if you were given a brace, keep it on),  lift the leg to 60 degrees, hold for 3 seconds, and slowly lower the leg.  Perform this exercise against resistance later as your leg gets stronger.  Leg Slides: Lying on your back, slowly slide your foot toward your buttocks, bending your knee up off the floor (only go as far as is comfortable). Then slowly slide your foot back down until your leg is flat on the floor again.  Angel Wings: Lying on your back spread your legs to the side as far apart as you can without causing discomfort.  Hamstring Strength:  Lying on your back, push your heel against the floor with your leg straight by tightening up the muscles of your buttocks.  Repeat, but this time bend your knee to a comfortable angle, and push your heel against the floor.  You may put a pillow under the heel to make it more comfortable if necessary.   A rehabilitation program following joint replacement surgery can speed recovery and prevent re-injury in the future due to weakened muscles. Contact your doctor or a physical therapist for more information on knee rehabilitation.    CONSTIPATION  Constipation is defined medically as fewer than three stools per week and severe constipation as less than one stool per week.  Even if  you have a regular bowel pattern at home, your normal regimen is likely to be disrupted due to multiple reasons following surgery.  Combination of anesthesia, postoperative narcotics, change in appetite and fluid intake all can affect your bowels.   YOU MUST use at least one of the following options; they are listed in order of increasing strength to get the job done.  They are all available over the counter, and you may need to use some, POSSIBLY even all of these options:    Drink plenty of fluids (prune juice may be helpful) and high fiber foods Colace 100 mg by mouth twice a day  Senokot for constipation as directed and as needed Dulcolax (bisacodyl), take with full glass of water  Miralax (polyethylene glycol) once or twice a day as needed.  If you have tried all these things and are unable to have a bowel movement in the first 3-4 days after surgery call either your surgeon or your primary doctor.    If you experience loose stools or diarrhea, hold the medications until you stool forms back up.  If your symptoms do not get better within 1 week or if they get worse, check with your doctor.  If you experience "the worst abdominal pain ever" or develop nausea or vomiting, please contact the office immediately for further recommendations for treatment.   ITCHING:  If you experience itching with your medications, try taking only a single pain pill, or even half a pain pill at a time.  You can also use Benadryl over the counter for itching or also to help with sleep.   TED HOSE STOCKINGS:  Use stockings on both legs until for at least 2 weeks or as directed by physician office. They may be removed at night for sleeping.  MEDICATIONS:  See your medication summary on the "After Visit Summary" that nursing will review with you.  You may have some home medications which will be placed on hold until you complete the course of blood thinner medication.  It is important for you to complete the blood  thinner medication as prescribed.  PRECAUTIONS:  If you experience chest pain or shortness of breath - call 911 immediately for transfer to the  hospital emergency department.   If you develop a fever greater that 101 F, purulent drainage from wound, increased redness or drainage from wound, foul odor from the wound/dressing, or calf pain - CONTACT YOUR SURGEON.                                                   FOLLOW-UP APPOINTMENTS:  If you do not already have a post-op appointment, please call the office [phone] for an appointment to be seen by your surgeon.  Guidelines for how soon to be seen are listed in your "After Visit Summary", but are typically between 1-4 weeks after surgery.  OTHER INSTRUCTIONS:   Knee Replacement:  Do not place pillow under knee, focus on keeping the knee straight while resting. CPM instructions: 0-90 degrees, 2 hours in the morning, 2 hours in the afternoon, and 2 hours in the evening. Place foam block, curve side up under heel at all times except when in CPM or when walking.  DO NOT modify, tear, cut, or change the foam block in any way.  MAKE SURE YOU:  Understand these instructions.  Get help right away if you are not doing well or get worse.    Thank you for letting us be a part of your medical care team.  It is a privilege we respect greatly.  We hope these instructions will help you stay on track for a fast and full recovery!     Do not put a pillow under the knee. Place it under the heel.    Complete by:  As directed      Increase activity slowly as tolerated    Complete by:  As directed      TED hose    Complete by:  As directed   Use stockings (TED hose) for 2 weeks on both leg(s).  You may remove them at night for sleeping.            Medication List    STOP taking these medications        CORICIDIN HBP COLD/FLU PO      TAKE these medications        aspirin EC 325 MG tablet  Take 1 tablet (325 mg total) by mouth 2 (two) times daily.      baclofen 10 MG tablet  Commonly known as:  LIORESAL  Take 1 tablet (10 mg total) by mouth 3 (three) times daily as needed for muscle spasms.     diclofenac 75 MG EC tablet  Commonly known as:  VOLTAREN  TAKE 1 TABLET 2 TIMES A DAY     ferrous sulfate 325 (65 FE) MG tablet  Take 1 tablet (325 mg total) by mouth 3 (three) times daily after meals.     oxyCODONE-acetaminophen 5-325 MG per tablet  Commonly known as:  ROXICET  Take 1-2 tablets by mouth every 4 (four) hours as needed for severe pain.     valsartan-hydrochlorothiazide 320-12.5 MG per tablet  Commonly known as:  DIOVAN-HCT  Take 1 tablet by mouth daily.     VOLTAREN 1 % Gel  Generic drug:  diclofenac sodium  Apply 2 g topically 4 (four) times daily as needed (pain).           Follow-up Information    Follow up with Erasmo LeventhalOLLINS,ROBERT ANDREW, MD. Go in 2 weeks.  Specialty:  Orthopedic Surgery   Contact information:   [address] [place] [address] [phone]       Signed: Markham Jordan 05/14/2015, 8:16 AM

## 2015-05-14 NOTE — Progress Notes (Signed)
Occupational Therapy Evaluation Patient Details Name: Russell Wagner MRN: [id number] DOB: 03-Jan-1941 Today's Date: 05/14/2015    History of Present Illness s/p R TKA   Clinical Impression   All OT education completed due to pending discharge today. He reports his wife will assist him with ADLs as needed.    Follow Up Recommendations  Home health OT;Supervision/Assistance - 24 hour    Equipment Recommendations  None recommended by OT (has all needed equipment)    Recommendations for Other Services PT consult     Precautions / Restrictions Precautions Precautions: Fall;Knee Required Braces or Orthoses: Knee Immobilizer - Right Knee Immobilizer - Right: Discontinue once straight leg raise with < 10 degree lag Restrictions Other Position/Activity Restrictions: WBAT      Mobility Bed Mobility Overal bed mobility: Needs Assistance Bed Mobility: Sit to Supine;Supine to Sit       Sit to supine: Min assist   General bed mobility comments: assist with RLE onto bed  Transfers Overall transfer level: Needs assistance Equipment used: Rolling walker (2 wheeled) Transfers: Sit to/from Stand Sit to Stand: Min guard         General transfer comment: cues for safety and hand placement    Balance                                            ADL Overall ADL's : Needs assistance/impaired Eating/Feeding: Independent   Grooming: Wash/dry hands;Wash/dry face;Set up;Sitting;Bed level   Upper Body Bathing: Set up;Sitting   Lower Body Bathing: Moderate assistance;Sit to/from stand;Maximal assistance   Upper Body Dressing : Set up;Sitting   Lower Body Dressing: Moderate assistance;Maximal assistance;Sit to/from stand   Toilet Transfer: Min guard;Cueing for sequencing;Stand-pivot;Ambulation;BSC;RW   Toileting- Clothing Manipulation and Hygiene: Moderate assistance;Sit to/from stand   Tub/ Shower Transfer: Walk-in shower;Minimal assistance;Shower  seat;Ambulation;Rolling walker   Functional mobility during ADLs: Min guard General ADL Comments: Patient reports his wife will assist with ADLs as needed. Reviewed LB dressing techniques with patient and he verbalized understanding. He was unable to don/doff his R sock. During ambulation to bathroom with RW, he needed mod cues for sequencing and technique as his left leg tended to lag behind. Difficult to cue due [facility]. Toilet transfer with BSC over toilet with min guard A and cues technique. Shower transfer education and practice with min A. Patient stated he would sponge bathe for now and would not try shower until he felt more steady. Patient reports wife will assist with bathing and dressing as needed.      Vision     Perception     Praxis      Pertinent Vitals/Pain Pain Assessment: 0-10 Pain Score: 4  Pain Location: R knee Pain Descriptors / Indicators: Sore Pain Intervention(s): Limited activity within patient's tolerance     Hand Dominance Left   Extremity/Trunk Assessment Upper Extremity Assessment Upper Extremity Assessment: Overall WFL for tasks assessed   Lower Extremity Assessment Lower Extremity Assessment: Defer to PT evaluation       Communication Communication Communication: HOH;No difficulties   Cognition Arousal/Alertness: Awake/alert Behavior During Therapy: WFL for tasks assessed/performed Overall Cognitive Status: Within Functional Limits for tasks assessed                     General Comments       Exercises       Shoulder Instructions  Home Living Family/patient expects to be discharged to:: Private residence Living Arrangements: Spouse/significant other Available Help at Discharge: Family Type of Home: House Home Access: Stairs to enter Secretary/administrator of Steps: 2   Home Layout: One level     Bathroom Shower/Tub: Producer, television/film/video: Standard Bathroom Accessibility: Yes How Accessible: Accessible  via walker Home Equipment: Walker - 2 wheels;Bedside commode;Crutches;Walker - 4 wheels;Shower seat - built in          Prior Functioning/Environment Level of Independence: Independent             OT Diagnosis: Acute pain   OT Problem List: Decreased strength;Decreased range of motion;Decreased activity tolerance;Pain   OT Treatment/Interventions:      OT Goals(Current goals can be found in the care plan section) Acute Rehab OT Goals Patient Stated Goal: to go home today OT Goal Formulation: All assessment and education complete, DC therapy  OT Frequency:     Barriers to D/C:            Co-evaluation              End of Session Equipment Utilized During Treatment: Rolling walker;Right knee immobilizer  Activity Tolerance: Patient tolerated treatment well Patient left: in bed;with call bell/phone within reach   Time: 1610-9604 OT Time Calculation (min): 23 min Charges:  OT General Charges $OT Visit: 1 Procedure OT Evaluation $Initial OT Evaluation Tier I: 1 Procedure OT Treatments $Self Care/Home Management : 8-22 mins G-Codes:    Russell Wagner A 05-23-2015, 9:57 AM

## 2015-05-15 ENCOUNTER — Encounter (HOSPITAL_COMMUNITY): Payer: Self-pay | Admitting: Specialist

## 2015-05-15 DIAGNOSIS — R260 Ataxic gait: Secondary | ICD-10-CM | POA: Diagnosis not present

## 2015-05-15 DIAGNOSIS — I10 Essential (primary) hypertension: Secondary | ICD-10-CM | POA: Diagnosis not present

## 2015-05-15 DIAGNOSIS — Z96651 Presence of right artificial knee joint: Secondary | ICD-10-CM | POA: Diagnosis not present

## 2015-05-15 DIAGNOSIS — M6281 Muscle weakness (generalized): Secondary | ICD-10-CM | POA: Diagnosis not present

## 2015-05-15 DIAGNOSIS — Z471 Aftercare following joint replacement surgery: Secondary | ICD-10-CM | POA: Diagnosis not present

## 2015-05-16 LAB — TYPE AND SCREEN
ABO/RH(D): O POS
Antibody Screen: POSITIVE
DAT, IGG: NEGATIVE
Donor AG Type: NEGATIVE
Unit division: 0

## 2015-05-17 DIAGNOSIS — I10 Essential (primary) hypertension: Secondary | ICD-10-CM | POA: Diagnosis not present

## 2015-05-17 DIAGNOSIS — M6281 Muscle weakness (generalized): Secondary | ICD-10-CM | POA: Diagnosis not present

## 2015-05-17 DIAGNOSIS — Z96651 Presence of right artificial knee joint: Secondary | ICD-10-CM | POA: Diagnosis not present

## 2015-05-17 DIAGNOSIS — R260 Ataxic gait: Secondary | ICD-10-CM | POA: Diagnosis not present

## 2015-05-17 DIAGNOSIS — Z471 Aftercare following joint replacement surgery: Secondary | ICD-10-CM | POA: Diagnosis not present

## 2015-05-17 NOTE — Addendum Note (Signed)
Addendum  created 05/17/15 1036 by Ronelle Nighharles Erine Phenix, MD   Modules edited: Anesthesia Attestations

## 2015-05-19 DIAGNOSIS — Z96651 Presence of right artificial knee joint: Secondary | ICD-10-CM | POA: Diagnosis not present

## 2015-05-19 DIAGNOSIS — Z471 Aftercare following joint replacement surgery: Secondary | ICD-10-CM | POA: Diagnosis not present

## 2015-05-19 DIAGNOSIS — I10 Essential (primary) hypertension: Secondary | ICD-10-CM | POA: Diagnosis not present

## 2015-05-19 DIAGNOSIS — M6281 Muscle weakness (generalized): Secondary | ICD-10-CM | POA: Diagnosis not present

## 2015-05-19 DIAGNOSIS — R260 Ataxic gait: Secondary | ICD-10-CM | POA: Diagnosis not present

## 2015-05-22 DIAGNOSIS — Z471 Aftercare following joint replacement surgery: Secondary | ICD-10-CM | POA: Diagnosis not present

## 2015-05-22 DIAGNOSIS — R260 Ataxic gait: Secondary | ICD-10-CM | POA: Diagnosis not present

## 2015-05-22 DIAGNOSIS — I10 Essential (primary) hypertension: Secondary | ICD-10-CM | POA: Diagnosis not present

## 2015-05-22 DIAGNOSIS — M6281 Muscle weakness (generalized): Secondary | ICD-10-CM | POA: Diagnosis not present

## 2015-05-22 DIAGNOSIS — Z96651 Presence of right artificial knee joint: Secondary | ICD-10-CM | POA: Diagnosis not present

## 2015-05-24 DIAGNOSIS — Z96651 Presence of right artificial knee joint: Secondary | ICD-10-CM | POA: Diagnosis not present

## 2015-05-24 DIAGNOSIS — I10 Essential (primary) hypertension: Secondary | ICD-10-CM | POA: Diagnosis not present

## 2015-05-24 DIAGNOSIS — Z471 Aftercare following joint replacement surgery: Secondary | ICD-10-CM | POA: Diagnosis not present

## 2015-05-24 DIAGNOSIS — R260 Ataxic gait: Secondary | ICD-10-CM | POA: Diagnosis not present

## 2015-05-24 DIAGNOSIS — M6281 Muscle weakness (generalized): Secondary | ICD-10-CM | POA: Diagnosis not present

## 2015-05-25 DIAGNOSIS — Z96651 Presence of right artificial knee joint: Secondary | ICD-10-CM | POA: Diagnosis not present

## 2015-05-25 DIAGNOSIS — I10 Essential (primary) hypertension: Secondary | ICD-10-CM | POA: Diagnosis not present

## 2015-05-25 DIAGNOSIS — Z471 Aftercare following joint replacement surgery: Secondary | ICD-10-CM | POA: Diagnosis not present

## 2015-05-25 DIAGNOSIS — R260 Ataxic gait: Secondary | ICD-10-CM | POA: Diagnosis not present

## 2015-05-25 DIAGNOSIS — M6281 Muscle weakness (generalized): Secondary | ICD-10-CM | POA: Diagnosis not present

## 2015-05-26 DIAGNOSIS — Z96651 Presence of right artificial knee joint: Secondary | ICD-10-CM | POA: Diagnosis not present

## 2015-05-26 DIAGNOSIS — Z471 Aftercare following joint replacement surgery: Secondary | ICD-10-CM | POA: Diagnosis not present

## 2015-05-29 ENCOUNTER — Ambulatory Visit: Payer: Medicare Other | Attending: Specialist | Admitting: Physical Therapy

## 2015-05-29 DIAGNOSIS — M25661 Stiffness of right knee, not elsewhere classified: Secondary | ICD-10-CM | POA: Insufficient documentation

## 2015-05-29 DIAGNOSIS — M25561 Pain in right knee: Secondary | ICD-10-CM | POA: Insufficient documentation

## 2015-05-29 NOTE — Therapy (Signed)
[facility] [address] [place], [place], [address] Phone: [phone]   Fax:  [phone]  Physical Therapy Evaluation  Patient Details  Name: Russell Wagner MRN: [id number] Date of Birth: 1941-09-07 Referring Provider:  Eugenia Mcalpine, MD  Encounter Date: 05/29/2015      PT End of Session - 05/29/15 0936    Visit Number 1   Number of Visits 12   Date for PT Re-Evaluation 07/24/15   PT Start Time 0900   PT Stop Time 0947   PT Time Calculation (min) 47 min   Equipment Utilized During Treatment Right knee immobilizer   Activity Tolerance Patient tolerated treatment well   Behavior During Therapy [facility] for tasks assessed/performed      Past Medical History  Diagnosis Date  . Hypertension   . Allergy     seasonal   . Pneumonia     hx of in childhood  . Arthritis     Past Surgical History  Procedure Laterality Date  . Left total knee replacement       9 years ago   . Colonscopy     . Upper gi endoscopy    . Abdominal aortic aneurysm repair      5 years ago   . Hernia repair      right groin area 40 years ago   . Total knee arthroplasty Right 05/12/2015    Procedure: RIGHT TOTAL KNEE ARTHROPLASTY;  Surgeon: Eugenia Mcalpine, MD;  Location: [facility];  Service: Orthopedics;  Laterality: Right;    There were no vitals filed for this visit.  Visit Diagnosis:  Right knee pain - Plan: PT plan of care cert/re-cert  Knee stiffness, right - Plan: PT plan of care cert/re-cert      Subjective Assessment - 05/29/15 0922    Subjective Doing well.   Patient Stated Goals Get out of pain.   Pain Score 2    Pain Location Knee   Pain Orientation Right   Pain Descriptors / Indicators Aching;Spasm   Pain Frequency Intermittent            [facility] PT Assessment - 05/29/15 0001    Assessment   Medical Diagnosis Right total knee replacment.   Onset Date/Surgical Date --  05/12/15.   Precautions   Precautions --  No ultrasound.   Restrictions   Weight Bearing Restrictions No   Balance Screen   Has the patient fallen in the past 6 months No   Has the patient had a decrease in activity level because of a fear of falling?  No   Is the patient reluctant to leave their home because of a fear of falling?  No   Home Environment   Living Environment Private residence   Prior Function   Level of Independence Independent   Observation/Other Assessments-Edema    Edema --  Mid-pat right 3 cms > left.   ROM / Strength   AROM / PROM / Strength AROM;PROM;Strength   AROM   Overall AROM Comments -17 degrees to 122 degrees.   PROM   Overall PROM Comments -14 degrees to 128 degrees.   Strength   Overall Strength Comments Decreased volitional contraction of right quadriceps musculature.   Palpation   Palpation comment --  Mild anterior right knee pain.   Ambulation/Gait   Gait Comments Safe ambulation with astraight cane.                   [facility] Adult PT Treatment/Exercise -  2015-06-02 0001    Exercises   Exercises Knee/Hip   Knee/Hip Exercises: Supine   Short Arc Quad Sets Limitations 15 minutes with 10 second extension holds and 10 second rests.   Modalities   Modalities Administrator, sports Stimulation Location Right quadriceps VMS x 15 minutes.                  PT Short Term Goals - 06/02/2015 0944    PT SHORT TERM GOAL #1   Title Ind with initial HEP.   Time 2   Period Weeks   Status New           PT Long Term Goals - 06-02-15 0944    PT LONG TERM GOAL #1   Title Ind with an advanced HEP.   Time 4   Period Weeks   Status New   PT LONG TERM GOAL #2   Title Full active right knee extension to normalize gait.   Time 4   Period Weeks   Status New   PT LONG TERM GOAL #3   Title Active right knee flexion to 130-135 degrees.   Time 4   Period Weeks   Status New   PT LONG TERM GOAL #4   Title 5/5 right knee strength to increase atbility for  functional tasks.   Time 4   Period Weeks   Status New   PT LONG TERM GOAL #5   Title Walk a community distance without an asssitive device.   Time 4   Period Weeks   Status New               Plan - 2015/06/02 0936    Pt will benefit from skilled therapeutic intervention in order to improve on the following deficits Pain;Decreased activity tolerance;Decreased strength   Rehab Potential Excellent   PT Frequency 3x / week   PT Duration 4 weeks          G-Codes - 06/02/2015 0941    Functional Assessment Tool Used FOTO.   Functional Limitation Mobility: Walking and moving around   Mobility: Walking and Moving Around Current Status [phone]) At least 40 percent but less than 60 percent impaired, limited or restricted   Mobility: Walking and Moving Around Goal Status [phone]) At least 20 percent but less than 40 percent impaired, limited or restricted       Problem List Patient Active Problem List   Diagnosis Date Noted  . Primary osteoarthritis of right knee 05/12/2015  . S/P total knee arthroplasty 05/12/2015  . S/P knee replacement 05/12/2015  . Hyperlipidemia 03/20/2011  . HTN (hypertension) 03/20/2011  . Allergic rhinitis, cause unspecified 03/20/2011  . Osteoarthritis 03/20/2011  . HYPERTENSION 09/26/2009  . ABDOMINAL AORTIC ANEURYSM 09/25/2009    Zakarie Sturdivant, [place] MPT Jun 02, 2015, 9:48 AM  [facility] [address] [place], [place], [address] Phone: [phone]   Fax:  [phone]

## 2015-05-31 ENCOUNTER — Ambulatory Visit: Payer: Medicare Other | Admitting: Physical Therapy

## 2015-05-31 DIAGNOSIS — M25561 Pain in right knee: Secondary | ICD-10-CM | POA: Diagnosis not present

## 2015-05-31 DIAGNOSIS — M25661 Stiffness of right knee, not elsewhere classified: Secondary | ICD-10-CM

## 2015-05-31 NOTE — Therapy (Signed)
[facility] [address] [place], [place], [address] Phone: [phone]   Fax:  [phone]  Physical Therapy Treatment  Patient Details  Name: Russell Wagner MRN: [id number] Date of Birth: January 11, 1941 Referring Provider:  Horald Pollen, PA-C  Encounter Date: 05/31/2015      PT End of Session - 05/31/15 0945    Visit Number 2   Number of Visits 12   Date for PT Re-Evaluation 07/24/15   PT Start Time 0943   PT Stop Time 1032   PT Time Calculation (min) 49 min      Past Medical History  Diagnosis Date  . Hypertension   . Allergy     seasonal   . Pneumonia     hx of in childhood  . Arthritis     Past Surgical History  Procedure Laterality Date  . Left total knee replacement       9 years ago   . Colonscopy     . Upper gi endoscopy    . Abdominal aortic aneurysm repair      5 years ago   . Hernia repair      right groin area 40 years ago   . Total knee arthroplasty Right 05/12/2015    Procedure: RIGHT TOTAL KNEE ARTHROPLASTY;  Surgeon: Eugenia Mcalpine, MD;  Location: [facility];  Service: Orthopedics;  Laterality: Right;    There were no vitals filed for this visit.  Visit Diagnosis:  Right knee pain  Knee stiffness, right      Subjective Assessment - 05/31/15 0945    Subjective No new complaints.   Patient Stated Goals Get out of pain.   Pain Score 2    Pain Location Knee   Pain Orientation Right   Pain Descriptors / Indicators Aching;Spasm   Pain Type Surgical pain   Pain Frequency Intermittent                         [facility] Adult PT Treatment/Exercise - 05/31/15 0001    Knee/Hip Exercises: Aerobic   Stationary Bike --  Stationary bike level 1 x 10 minutes.   Knee/Hip Exercises: Supine   Short Arc Quad Sets Limitations 15 minutes facilitated with VMS to right quadriceps (10 second extension holds/10 second relax).   Modalities   Modalities Cryotherapy;Electrical Stimulation   Cryotherapy    Number Minutes Cryotherapy 15 Minutes   Cryotherapy Location --  Right knee.   Programme researcher, broadcasting/film/video Location --  Right knee.   Electrical Stimulation Action --  1-10 HZ x 15 minutes.                  PT Short Term Goals - 05/29/15 0944    PT SHORT TERM GOAL #1   Title Ind with initial HEP.   Time 2   Period Weeks   Status New           PT Long Term Goals - 05/29/15 0944    PT LONG TERM GOAL #1   Title Ind with an advanced HEP.   Time 4   Period Weeks   Status New   PT LONG TERM GOAL #2   Title Full active right knee extension to normalize gait.   Time 4   Period Weeks   Status New   PT LONG TERM GOAL #3   Title Active right knee flexion to 130-135 degrees.   Time 4   Period Weeks  Status New   PT LONG TERM GOAL #4   Title 5/5 right knee strength to increase atbility for functional tasks.   Time 4   Period Weeks   Status New   PT LONG TERM GOAL #5   Title Walk a community distance without an asssitive device.   Time 4   Period Weeks   Status New               Problem List Patient Active Problem List   Diagnosis Date Noted  . Primary osteoarthritis of right knee 05/12/2015  . S/P total knee arthroplasty 05/12/2015  . S/P knee replacement 05/12/2015  . Hyperlipidemia 03/20/2011  . HTN (hypertension) 03/20/2011  . Allergic rhinitis, cause unspecified 03/20/2011  . Osteoarthritis 03/20/2011  . HYPERTENSION 09/26/2009  . ABDOMINAL AORTIC ANEURYSM 09/25/2009    [place], [place] MPT 05/31/2015, 10:33 AM  [facility] [address] [place], [place], [address] Phone: [phone]   Fax:  [phone]

## 2015-06-05 ENCOUNTER — Encounter: Payer: Self-pay | Admitting: Physical Therapy

## 2015-06-05 ENCOUNTER — Ambulatory Visit: Payer: Medicare Other | Admitting: Physical Therapy

## 2015-06-05 DIAGNOSIS — M25661 Stiffness of right knee, not elsewhere classified: Secondary | ICD-10-CM

## 2015-06-05 DIAGNOSIS — M25561 Pain in right knee: Secondary | ICD-10-CM | POA: Diagnosis not present

## 2015-06-05 NOTE — Therapy (Addendum)
[facility] [place], [place], [address] Phone: [phone]   Fax:  [phone]  Physical Therapy Treatment  Patient Details  Name: Russell Wagner MRN: [id number] Date of Birth: 1941-09-22 Referring Provider:  Gearlean Alf, PA-C  Encounter Date: 06/05/2015      PT End of Session - 06/05/15 0911    Visit Number 3   Number of Visits 12   Date for PT Re-Evaluation 07/24/15   PT Start Time 0902   PT Stop Time 0949   PT Time Calculation (min) 47 min   Equipment Utilized During Treatment Other (comment)  SPC   Activity Tolerance Patient tolerated treatment well   Behavior During Therapy [facility] for tasks assessed/performed      Past Medical History  Diagnosis Date  . Hypertension   . Allergy     seasonal   . Pneumonia     hx of in childhood  . Arthritis     Past Surgical History  Procedure Laterality Date  . Left total knee replacement       9 years ago   . Colonscopy     . Upper gi endoscopy    . Abdominal aortic aneurysm repair      5 years ago   . Hernia repair      right groin area 40 years ago   . Total knee arthroplasty Right 05/12/2015    Procedure: RIGHT TOTAL KNEE ARTHROPLASTY;  Surgeon: Sydnee Cabal, MD;  Location: [facility];  Service: Orthopedics;  Laterality: Right;    There were no vitals filed for this visit.  Visit Diagnosis:  Right knee pain  Knee stiffness, right      Subjective Assessment - 06/05/15 0910    Subjective States that he hasn't taken pain meds in a few days but his knee doesn't really hurt. Only reports stiffness and soreness this morning. Reports that he did not like vasopneumatic system d/t being too cold.    Patient Stated Goals Get out of pain.   Currently in Pain? No/denies            [facility] PT Assessment - 06/05/15 0001    Assessment   Medical Diagnosis Right total knee replacment.   Onset Date/Surgical Date 05/12/15   Next MD Visit 06/07/2015   ROM / Strength    AROM / PROM / Strength AROM   AROM   Overall AROM  Within functional limits for tasks performed   AROM Assessment Site Knee   Right/Left Knee Right   Right Knee Extension 0   Right Knee Flexion 125                     [facility] Adult PT Treatment/Exercise - 06/05/15 0001    Exercises   Exercises Knee/Hip   Knee/Hip Exercises: Stretches   Active Hamstring Stretch Right;3 reps;30 seconds   Knee/Hip Exercises: Aerobic   Stationary Bike x10 min   Knee/Hip Exercises: Standing   Forward Lunges Right;3 sets;10 reps   Forward Step Up Right;3 sets;10 reps;Step Height: 6"   Rocker Board --   Knee/Hip Exercises: Supine   Short Arc Education administrator (comment)  with VMS and 2# ankle weight   Modalities   Modalities Retail buyer Location R VMO/ Market researcher VMS with SAQ, 2#   Electrical Stimulation Parameters 10/10 x15 min   Electrical Stimulation Goals Strength   Manual Therapy  Manual Therapy Soft tissue mobilization;Passive ROM   Soft tissue mobilization R patellar mobilizations in all directions, tilts   Passive ROM R knee flex/ext with gentle hold at end range in supine                  PT Short Term Goals - 06/05/15 0092    PT SHORT TERM GOAL #1   Title Ind with initial HEP.   Time 2   Period Weeks   Status On-going           PT Long Term Goals - 06/05/15 3300    PT LONG TERM GOAL #1   Title Ind with an advanced HEP.   Time 4   Period Weeks   Status On-going   PT LONG TERM GOAL #2   Title Full active right knee extension to normalize gait.   Time 4   Period Weeks   Status Achieved   PT LONG TERM GOAL #3   Title Active right knee flexion to 130-135 degrees.   Time 4   Period Weeks   Status On-going   PT LONG TERM GOAL #4   Title 5/5 right knee strength to increase atbility for functional tasks.   Time 4   Period Weeks   Status  On-going   PT LONG TERM GOAL #5   Title Walk a community distance without an asssitive device.   Time 4   Period Weeks   Status On-going               Plan - 06/05/15 0936    Clinical Impression Statement Patient is progressing very well in regards to therapy for R TKR. Achieved LT goal for full active R knee extension today in clinic. AROM R knee today was 0-125 deg. Demonstrates good R patellar mobility in all directions as well as tilits. All remaining goals are on-going at this time secondary to decreased R knee flexion, strength, and ambulation with SPC. Normal modalites response noted following removal of the modlaties. Denied R knee pain only back pain following treatment.   Pt will benefit from skilled therapeutic intervention in order to improve on the following deficits Pain;Decreased activity tolerance   Rehab Potential Excellent   PT Frequency 3x / week   PT Duration 4 weeks   Consulted and Agree with Plan of Care Patient        Problem List Patient Active Problem List   Diagnosis Date Noted  . Primary osteoarthritis of right knee 05/12/2015  . S/P total knee arthroplasty 05/12/2015  . S/P knee replacement 05/12/2015  . Hyperlipidemia 03/20/2011  . HTN (hypertension) 03/20/2011  . Allergic rhinitis, cause unspecified 03/20/2011  . Osteoarthritis 03/20/2011  . HYPERTENSION 09/26/2009  . ABDOMINAL AORTIC ANEURYSM 09/25/2009    Wynelle Fanny, PTA 06/05/2015, 9:53 AM  [facility] [address] [place], [place], [address] Phone: [phone]   Fax:  [phone]    PHYSICAL THERAPY DISCHARGE SUMMARY  Visits from Start of Care: 3  Current functional level related to goals / functional outcomes: See above   Remaining deficits: See above   Education / Equipment: HEP  Plan: Patient agrees to discharge.  Patient goals were partially met. Patient is being discharged due to being pleased with the current  functional level.  Patient phoned and stated MD said he was fine to stop therapy and continue with HEP. Patient plans to join gym program.?????        Madelyn Flavors, [place] 06/08/2015  [facility] [place], [place], [address] Phone: [phone]   Fax:  [phone]

## 2015-06-08 ENCOUNTER — Ambulatory Visit: Payer: Medicare Other | Admitting: *Deleted

## 2016-09-09 NOTE — Therapy (Signed)
[facility] [address] [place], , [address] Phone: [phone]   Fax:  [phone]  Physical Therapy Treatment  Patient Details  Name: Russell Wagner MRN: [id number] Date of Birth: 01/05/1941 No Data Recorded  Encounter Date: 06/05/2015    Past Medical History:  Diagnosis Date  . Allergy    seasonal   . Arthritis   . Hypertension   . Pneumonia    hx of in childhood    Past Surgical History:  Procedure Laterality Date  . ABDOMINAL AORTIC ANEURYSM REPAIR     5 years ago   . colonscopy     . HERNIA REPAIR     right groin area 40 years ago   . left total knee replacement      9 years ago   . TOTAL KNEE ARTHROPLASTY Right 05/12/2015   Procedure: RIGHT TOTAL KNEE ARTHROPLASTY;  Surgeon: Robert Collins, MD;  Location: [facility];  Service: Orthopedics;  Laterality: Right;  . UPPER GI ENDOSCOPY      There were no vitals filed for this visit.                                 PT Short Term Goals - 06/05/15 0937      PT SHORT TERM GOAL #1   Title Ind with initial HEP.   Time 2   Period Weeks   Status On-going           PT Long Term Goals - 06/05/15 0937      PT LONG TERM GOAL #1   Title Ind with an advanced HEP.   Time 4   Period Weeks   Status On-going     PT LONG TERM GOAL #2   Title Full active right knee extension to normalize gait.   Time 4   Period Weeks   Status Achieved     PT LONG TERM GOAL #3   Title Active right knee flexion to 130-135 degrees.   Time 4   Period Weeks   Status On-going     PT LONG TERM GOAL #4   Title 5/5 right knee strength to increase atbility for functional tasks.   Time 4   Period Weeks   Status On-going     PT LONG TERM GOAL #5   Title Walk a community distance without an asssitive device.   Time 4   Period Weeks   Status On-going             Patient will benefit from skilled therapeutic intervention in order to improve the  following deficits and impairments:  Pain, Decreased activity tolerance  Visit Diagnosis: Right knee pain  Knee stiffness, right     Problem List Patient Active Problem List   Diagnosis Date Noted  . Primary osteoarthritis of right knee 05/12/2015  . S/P total knee arthroplasty 05/12/2015  . S/P knee replacement 05/12/2015  . Hyperlipidemia 03/20/2011  . HTN (hypertension) 03/20/2011  . Allergic rhinitis, cause unspecified 03/20/2011  . Osteoarthritis 03/20/2011  . HYPERTENSION 09/26/2009  . ABDOMINAL AORTIC ANEURYSM 09/25/2009  PHYSICAL THERAPY DISCHARGE SUMMARY  Visits from Start of Care:   Current functional level related to goals / functional outcomes: Please see above.   Remaining deficits: Continued loss of right knee ROM and weakness.   Education / Equipment: HEP. Plan: Patient agrees to discharge.  Patient goals were not met. Patient is being discharged due to                                                     ?????         Ladelle Teodoro, [place] MPT 09/09/2016, 6:15 PM  [place] [address] [place], [place], [address] Phone: [phone]   Fax:  [phone]  Name: SCOUT GUMBS MRN: [id number] Date of Birth: 10-13-1941
